# Patient Record
Sex: Female | Born: 1975 | Race: White | Hispanic: No | Marital: Single | Smoking: Never smoker
Health system: Southern US, Community
[De-identification: ages and names within clinical notes are randomized; demographics above are authoritative.]

## PROBLEM LIST (undated history)

## (undated) DIAGNOSIS — I10 Essential (primary) hypertension: Secondary | ICD-10-CM

## (undated) DIAGNOSIS — F419 Anxiety disorder, unspecified: Secondary | ICD-10-CM

## (undated) DIAGNOSIS — T7840XA Allergy, unspecified, initial encounter: Secondary | ICD-10-CM

## (undated) HISTORY — DX: Allergy, unspecified, initial encounter: T78.40XA

## (undated) HISTORY — DX: Essential (primary) hypertension: I10

## (undated) HISTORY — DX: Anxiety disorder, unspecified: F41.9

---

## 2001-08-14 ENCOUNTER — Emergency Department (HOSPITAL_COMMUNITY): Admission: EM | Admit: 2001-08-14 | Discharge: 2001-08-14 | Payer: Self-pay | Admitting: Emergency Medicine

## 2001-08-24 ENCOUNTER — Emergency Department (HOSPITAL_COMMUNITY): Admission: EM | Admit: 2001-08-24 | Discharge: 2001-08-24 | Payer: Self-pay | Admitting: Emergency Medicine

## 2004-02-07 ENCOUNTER — Encounter: Admission: RE | Admit: 2004-02-07 | Discharge: 2004-02-07 | Payer: Self-pay | Admitting: Obstetrics and Gynecology

## 2006-07-31 ENCOUNTER — Emergency Department (HOSPITAL_COMMUNITY): Admission: EM | Admit: 2006-07-31 | Discharge: 2006-07-31 | Payer: Self-pay | Admitting: Emergency Medicine

## 2013-01-07 ENCOUNTER — Other Ambulatory Visit: Payer: Self-pay | Admitting: Family Medicine

## 2013-01-07 ENCOUNTER — Other Ambulatory Visit (HOSPITAL_COMMUNITY)
Admission: RE | Admit: 2013-01-07 | Discharge: 2013-01-07 | Disposition: A | Discharge status: Home / Self Care | Payer: BC Managed Care – PPO | Source: Ambulatory Visit | Attending: Family Medicine | Admitting: Family Medicine

## 2013-01-07 DIAGNOSIS — Z0000 Encounter for general adult medical examination without abnormal findings: Secondary | ICD-10-CM | POA: Insufficient documentation

## 2016-07-23 ENCOUNTER — Ambulatory Visit (INDEPENDENT_AMBULATORY_CARE_PROVIDER_SITE_OTHER): Payer: BLUE CROSS/BLUE SHIELD | Admitting: Family Medicine

## 2016-07-23 ENCOUNTER — Encounter: Payer: Self-pay | Admitting: Family Medicine

## 2016-07-23 VITALS — BP 144/100 | HR 104 | Temp 99.3°F | Resp 18 | Ht 65.0 in | Wt 331.8 lb

## 2016-07-23 DIAGNOSIS — J301 Allergic rhinitis due to pollen: Secondary | ICD-10-CM | POA: Diagnosis not present

## 2016-07-23 DIAGNOSIS — R7303 Prediabetes: Secondary | ICD-10-CM

## 2016-07-23 DIAGNOSIS — I10 Essential (primary) hypertension: Secondary | ICD-10-CM | POA: Diagnosis not present

## 2016-07-23 MED ORDER — CETIRIZINE HCL 10 MG PO TABS: 10.0000 mg | ORAL_TABLET | Freq: Every day | ORAL | 11 refills | Status: DC

## 2016-07-23 MED ORDER — FLUTICASONE PROPIONATE 50 MCG/ACT NA SUSP: 2.0000 | Freq: Every day | NASAL | 6 refills | Status: DC

## 2016-07-23 MED ORDER — AMLODIPINE BESYLATE 5 MG PO TABS: 5.0000 mg | ORAL_TABLET | Freq: Every day | ORAL | 1 refills | Status: DC

## 2016-07-23 NOTE — Progress Notes (Signed)
Chief Complaint  Patient presents with  . Establish Care    HPI  Hypertension- deteriorated Pt reports that she is here for a six month check up for hypertension She reports that she wanted to establish care for her blood pressure She was started on blood pressure meds 3 years ago She states  BP Readings from Last 3 Encounters:  07/23/16 (!) 144/100  She is a nonsmoker She does not exercise  Morbid obesity She reports that she has tried walking in the past with exercise classes She reports that she has never done a nutritionist or weight watchers program This morning she had a sausage biscuit and hard boiled egg with orange juice. For lunch she had a hot pockets For dinner she plans to eat chicken and rice and broccoli Wt Readings from Last 3 Encounters:  07/23/16 (!) 331 lb 12.8 oz (150.5 kg)  Body mass index is 55.21 kg/m.  Allergic Rhinitis She reports sinus pressure and headache with stuffiness, cough and sneezing She states that she started having symptoms 3 days ago She denies fevers or chills In the mornings her mouth is dry and she is coughing a lot Her cough is typically a dry cough She has not tried anything for her symptoms    Past Medical History:  Diagnosis Date  . Allergy   . Anxiety   . Hypertension     Current Outpatient Prescriptions  Medication Sig Dispense Refill  . hydrochlorothiazide (HYDRODIURIL) 25 MG tablet Take 25 mg by mouth daily.    Marland Kitchen losartan (COZAAR) 100 MG tablet Take 100 mg by mouth daily.    Marland Kitchen PARoxetine (PAXIL) 20 MG tablet Take 20 mg by mouth daily.    Marland Kitchen amLODipine (NORVASC) 5 MG tablet Take 1 tablet (5 mg total) by mouth daily. 30 tablet 1  . cetirizine (ZYRTEC) 10 MG tablet Take 1 tablet (10 mg total) by mouth daily. 30 tablet 11  . fluticasone (FLONASE) 50 MCG/ACT nasal spray Place 2 sprays into both nostrils daily. 16 g 6   No current facility-administered medications for this visit.     Allergies:  Allergies  Allergen  Reactions  . Sulfa Antibiotics [place]    History reviewed. No pertinent surgical history.  Social History   Social History  . Marital status: Single    Spouse name: N/A  . Number of children: N/A  . Years of education: N/A   Social History Main Topics  . Smoking status: Never Smoker  . Smokeless tobacco: Never Used  . Alcohol use 3.0 oz/week    5 Glasses of wine per week     Comment: glasses a month  . Drug use: No  . Sexual activity: Not Asked   Other Topics Concern  . None   Social History Narrative  . None    ROS See hpi  Objective: Vitals:   07/23/16 1658  BP: (!) 144/100  Pulse: (!) 104  Resp: 18  Temp: 99.3 F (37.4 C)  TempSrc: Oral  SpO2: 96%  Weight: (!) 331 lb 12.8 oz (150.5 kg)  Height: 5\' 5"  (1.651 m)    Physical Exam  Constitutional: She is oriented to person, place, and time. She appears well-developed and well-nourished.  HENT:  Head: Normocephalic and atraumatic.  Right Ear: External ear normal.  Left Ear: External ear normal.  Mouth/Throat: Oropharynx is clear and moist.  Eyes: Conjunctivae and EOM are normal.  Cardiovascular: Normal rate, regular rhythm and normal heart sounds.   No murmur heard. Pulmonary/Chest:  Effort normal and breath sounds normal. No respiratory distress. She has no wheezes.  Musculoskeletal: Normal range of motion. She exhibits no edema.  Neurological: She is alert and oriented to person, place, and time. No cranial nerve deficit. Coordination normal.  Skin: Skin is warm. No erythema.    Assessment and Plan Novaly was seen today for establish care.  Diagnoses and all orders for this visit:  Essential hypertension- blood pressure uncontrolled Added amlodipine Discussed risks, benefits and side effects -     Basic metabolic panel  Morbid obesity ([place])- discussed strategies for weight loss Discussed nutrition and weight watchers type programs -     Hemoglobin A1c -     Ambulatory referral to diabetic  education  Prediabetes- advised pt to monitor her diet, calories and exercise Discussed that starting an ADA diet now can help slow progression Also discussed Metformin -     Hemoglobin A1c -     Ambulatory referral to diabetic education  Allergic Rhinitis Discussed adding flonase and zyrtec  Other orders -     amLODipine (NORVASC) 5 MG tablet; Take 1 tablet (5 mg total) by mouth daily. -     fluticasone (FLONASE) 50 MCG/ACT nasal spray; Place 2 sprays into both nostrils daily. -     cetirizine (ZYRTEC) 10 MG tablet; Take 1 tablet (10 mg total) by mouth daily.     [place]

## 2016-07-23 NOTE — Patient Instructions (Addendum)
IF you received an x-ray today, you will receive an invoice from [facility]. Please contact [facility] at [phone] with questions or concerns regarding your invoice.   IF you received labwork today, you will receive an invoice from [employer]. Please contact [facility] at [phone] with questions or concerns regarding your invoice.   Our billing staff will not be able to assist you with questions regarding bills from these companies.  You will be contacted with the lab results as soon as they are available. The fastest way to get your results is to activate your My Chart account. Instructions are located on the last page of this paperwork. If you have not heard from [place] regarding the results in 2 weeks, please contact this office.      Managing Your Hypertension Hypertension is commonly called high blood pressure. Blood pressure is a measurement of how strongly your blood is pressing against the walls of your arteries. Arteries are blood vessels that carry blood from your heart throughout your body. Blood pressure does not stay the same. It rises when you are active, excited, or nervous. It lowers when you are sleeping or relaxed. If the numbers that measure your blood pressure stay above normal most of the time, you are at risk for health problems. Hypertension is a long-term (chronic) condition in which blood pressure is elevated. This condition often has no signs or symptoms. The cause of the condition is usually not known. What are blood pressure readings? A blood pressure reading is recorded as two numbers, such as "120 over 80" (or 120/80). The first ("top") number is called the systolic pressure. It is a measure of the pressure in your arteries as the heart beats. The second ("bottom") number is called the diastolic pressure. It is a measure of the pressure in your arteries as the heart relaxes between beats. What does my blood  pressure reading mean? Blood pressure is classified into four stages. Based on your blood pressure reading, your health care provider may use the following stages to determine what type of treatment, if any, is needed. Systolic pressure and diastolic pressure are measured in a unit called mm Hg. Normal  Systolic pressure: below 123456.  Diastolic pressure: below 80. Prehypertension  Systolic pressure: 123456.  Diastolic pressure: [id number]. Hypertension stage 1  Systolic pressure: A999333.  Diastolic pressure: A999333. Hypertension stage 2  Systolic pressure: 0000000 or above.  Diastolic pressure: 123XX123 or above. What health risks are associated with hypertension? Managing your hypertension is an important responsibility. Uncontrolled hypertension can lead to:  A heart attack.  A stroke.  A weakened blood vessel (aneurysm).  Heart failure.  Kidney damage.  Eye damage.  Metabolic syndrome.  Memory and concentration problems. What changes can I make to manage my hypertension? Hypertension can be managed effectively by making lifestyle changes and possibly by taking medicines. Your health care provider will help you come up with a plan to bring your blood pressure within a normal range. Your plan should include the following: Monitoring  Monitor your blood pressure at home as told by your health care provider. Your personal target blood pressure may vary depending on your medical conditions, your age, and other factors.  Have your blood pressure rechecked as told by your health care provider. Lifestyle  Lose weight if necessary.  Get at least 30-45 minutes of aerobic exercise at least 4 times a week.  Do not use any products that contain nicotine or tobacco, such  as cigarettes and e-cigarettes. If you need help quitting, ask your health care provider.  Learn ways to reduce stress.  Control any chronic conditions, such as high cholesterol or diabetes. Eating and  drinking  Follow the DASH diet. This diet is high in fruits, vegetables, and whole grains. It is low in salt, red meat, and added sugars.  Keep your sodium intake below 2,300 mg per day.  Limit alcoholic beverages. Communication  Review all the medicines you take with your health care provider because there may be side effects or interactions.  Talk with your health care provider about your diet, exercise habits, and other lifestyle factors that may be contributing to hypertension.  See your health care provider regularly. Your health care provider can help you create and adjust your plan for managing hypertension. Will I need medicine to control my blood pressure? Your health care provider may prescribe medicine if lifestyle changes are not enough to get your blood pressure under control, and if one of the following is true:  You are 34-48 years of age, and your systolic blood pressure is 140 or higher.  You are 61 years of age or older, and your systolic blood pressure is 150 or higher.  Your diastolic blood pressure is 90 or higher.  You have diabetes, and your systolic blood pressure is over [id number] or your diastolic blood pressure is over 90.  You have kidney disease, and your blood pressure is above 140/90.  You have heart disease or a history of stroke, and your blood pressure is 140/90 or higher. Take medicines only as told by your health care provider. Follow the directions carefully. Blood pressure medicines must be taken as prescribed. The medicine does not work as well when you skip doses. Skipping doses also puts you at risk for problems. Contact a health care provider if:  You think you are having a reaction to medicines you have taken.  You have repeated (recurrent) headaches.  You feel dizzy.  You have swelling in your ankles.  You have trouble with your vision. Get help right away if:  You develop a severe headache or confusion.  You have unusual weakness or  numbness, or you feel faint.  You have severe pain in your chest or abdomen.  You vomit repeatedly.  You have trouble breathing. This information is not intended to replace advice given to you by your health care provider. Make sure you discuss any questions you have with your health care provider. Document Released: 04/21/2012 Document Revised: 04/01/2016 Document Reviewed: 10/26/2015 Elsevier Interactive Patient Education  2017 [employer].

## 2016-07-24 DIAGNOSIS — R7303 Prediabetes: Secondary | ICD-10-CM | POA: Insufficient documentation

## 2016-07-24 DIAGNOSIS — J301 Allergic rhinitis due to pollen: Secondary | ICD-10-CM | POA: Insufficient documentation

## 2016-07-24 DIAGNOSIS — I10 Essential (primary) hypertension: Secondary | ICD-10-CM | POA: Insufficient documentation

## 2016-07-24 LAB — HEMOGLOBIN A1C

## 2016-07-25 LAB — BASIC METABOLIC PANEL
BUN/Creatinine Ratio: 18 (ref 9–23)
BUN: 13 mg/dL (ref 6–20)
CO2: 27 mmol/L (ref 18–29)
CREATININE: 0.73 mg/dL (ref 0.57–1.00)
Calcium: 9.9 mg/dL (ref 8.7–10.2)
Chloride: 95 mmol/L — ABNORMAL LOW (ref 96–106)
GFR calc Af Amer: 121 mL/min/{1.73_m2} (ref >59)
GFR calc non Af Amer: 105 mL/min/{1.73_m2} (ref >59)
GLUCOSE: 124 mg/dL — AB (ref 65–99)
Potassium: 4.1 mmol/L (ref 3.5–5.2)
Sodium: 139 mmol/L (ref 134–144)

## 2016-09-03 ENCOUNTER — Ambulatory Visit (INDEPENDENT_AMBULATORY_CARE_PROVIDER_SITE_OTHER): Payer: BLUE CROSS/BLUE SHIELD | Admitting: Family Medicine

## 2016-09-03 ENCOUNTER — Encounter: Payer: Self-pay | Admitting: Family Medicine

## 2016-09-03 ENCOUNTER — Other Ambulatory Visit: Payer: Self-pay

## 2016-09-03 VITALS — BP 147/98 | HR 94 | Temp 99.0°F | Resp 18 | Ht 65.0 in | Wt 331.0 lb

## 2016-09-03 DIAGNOSIS — F411 Generalized anxiety disorder: Secondary | ICD-10-CM | POA: Diagnosis not present

## 2016-09-03 DIAGNOSIS — Z23 Encounter for immunization: Secondary | ICD-10-CM

## 2016-09-03 DIAGNOSIS — I10 Essential (primary) hypertension: Secondary | ICD-10-CM

## 2016-09-03 MED ORDER — AMLODIPINE BESYLATE 10 MG PO TABS: 10.0000 mg | ORAL_TABLET | Freq: Every day | ORAL | 3 refills | Status: DC

## 2016-09-03 MED ORDER — PAROXETINE HCL 20 MG PO TABS: 20.0000 mg | ORAL_TABLET | Freq: Every day | ORAL | 11 refills | Status: DC

## 2016-09-03 NOTE — Telephone Encounter (Signed)
Fax req [facility] [address]  Paroxetine Sent to [facility]

## 2016-09-03 NOTE — Patient Instructions (Addendum)
4-6 weeks follow up     IF you received an x-ray today, you will receive an invoice from [facility]. Please contact [facility] at [phone] with questions or concerns regarding your invoice.   IF you received labwork today, you will receive an invoice from [facility]. Please contact [facility] at [phone] with questions or concerns regarding your invoice.   Our billing staff will not be able to assist you with questions regarding bills from these companies.  You will be contacted with the lab results as soon as they are available. The fastest way to get your results is to activate your My Chart account. Instructions are located on the last page of this paperwork. If you have not heard from [place] regarding the results in 2 weeks, please contact this office.

## 2016-09-03 NOTE — Progress Notes (Signed)
Chief Complaint  Patient presents with  . Follow-up    BLOOD PRESSURE/PAXIL    HPI  Hypertension: Patient here for follow-up of elevated blood pressure. She is exercising and is adherent to low salt diet.  Blood pressure is well controlled at home. Cardiac symptoms none. Patient denies chest pain, chest pressure/discomfort, claudication, dyspnea and exertional chest pressure/discomfort.  Cardiovascular risk factors: hypertension and obesity (BMI >= 30 kg/m2). Use of agents associated with hypertension: none. History of target organ damage: none. BP Readings from Last 3 Encounters:  09/03/16 (!) 147/98  07/23/16 (!) 144/100   Obesity She states that she has days when she feels very hungry.  She tries not to over eat on those days but has difficulty. She is walking for exercise and tries to do 30 minutes. Wt Readings from Last 3 Encounters:  09/03/16 (!) 331 lb (150.1 kg)  07/23/16 (!) 331 lb 12.8 oz (150.5 kg)   Anxiety  She reports that she is doing well with Paxil No side effects She is walking for exercise. She denies panic attacks or depression.   Past Medical History:  Diagnosis Date  . Allergy   . Anxiety   . Hypertension     Current Outpatient Prescriptions  Medication Sig Dispense Refill  . amLODipine (NORVASC) 10 MG tablet Take 1 tablet (10 mg total) by mouth daily. 90 tablet 3  . cetirizine (ZYRTEC) 10 MG tablet Take 1 tablet (10 mg total) by mouth daily. 30 tablet 11  . fluticasone (FLONASE) 50 MCG/ACT nasal spray Place 2 sprays into both nostrils daily. 16 g 6  . hydrochlorothiazide (HYDRODIURIL) 25 MG tablet Take 25 mg by mouth daily.    Marland Kitchen losartan (COZAAR) 100 MG tablet Take 100 mg by mouth daily.    Marland Kitchen PARoxetine (PAXIL) 20 MG tablet Take 1 tablet (20 mg total) by mouth daily. 30 tablet 11   No current facility-administered medications for this visit.     Allergies:  Allergies  Allergen Reactions  . Sulfa Antibiotics [place]    History reviewed. No  pertinent surgical history.  Social History   Social History  . Marital status: Single    Spouse name: N/A  . Number of children: N/A  . Years of education: N/A   Social History Main Topics  . Smoking status: Never Smoker  . Smokeless tobacco: Never Used  . Alcohol use 3.0 oz/week    5 Glasses of wine per week     Comment: glasses a month  . Drug use: No  . Sexual activity: Not Asked   Other Topics Concern  . None   Social History Narrative  . None    ROS  Objective: Vitals:   09/03/16 1619 09/03/16 1712  BP: (!) 160/98 (!) 147/98  Pulse: 94   Resp: 18   Temp: 99 F (37.2 C)   TempSrc: Oral   SpO2: 96%   Weight: (!) 331 lb (150.1 kg)   Height: 5\' 5"  (1.651 m)     Physical Exam  Constitutional: She is oriented to person, place, and time. She appears well-developed and well-nourished.  HENT:  Head: Normocephalic and atraumatic.  Eyes: Conjunctivae and EOM are normal.  Cardiovascular: Normal rate, regular rhythm and normal heart sounds.   Pulmonary/Chest: Effort normal and breath sounds normal. No respiratory distress. She has no wheezes.  Musculoskeletal: Normal range of motion. She exhibits no edema.  Neurological: She is alert and oriented to person, place, and time.  Skin: Skin is warm. Capillary refill  takes less than 2 seconds. No erythema.  Psychiatric: She has a normal mood and affect. Her behavior is normal. Judgment and thought content normal.    Assessment and Plan [place] was seen today for follow-up.  Diagnoses and all orders for this visit:  Need for prophylactic vaccination and inoculation against influenza -     Flu Vaccine QUAD 36+ mos IM  Essential hypertension- bp improved on recheck, increased amlodipine dose  Morbid obesity ([place])- plateau noted, offered encouragement and discussed strategies  Anxiety state- continue Paxil and exercise  Other orders -     PARoxetine (PAXIL) 20 MG tablet; Take 1 tablet (20 mg total) by mouth  daily. -     amLODipine (NORVASC) 10 MG tablet; Take 1 tablet (10 mg total) by mouth daily.   Blood pressure check Rooming staff to notify me so that this can be an efficient visit  [place]

## 2016-10-08 ENCOUNTER — Ambulatory Visit (INDEPENDENT_AMBULATORY_CARE_PROVIDER_SITE_OTHER): Payer: BLUE CROSS/BLUE SHIELD | Admitting: Family Medicine

## 2016-10-08 ENCOUNTER — Encounter: Payer: Self-pay | Admitting: Family Medicine

## 2016-10-08 VITALS — BP 158/97 | HR 86 | Temp 99.0°F | Resp 18 | Ht 65.0 in | Wt 330.0 lb

## 2016-10-08 DIAGNOSIS — I10 Essential (primary) hypertension: Secondary | ICD-10-CM

## 2016-10-08 MED ORDER — CARVEDILOL 6.25 MG PO TABS: 6.2500 mg | ORAL_TABLET | Freq: Two times a day (BID) | ORAL | 3 refills | Status: DC

## 2016-10-08 NOTE — Progress Notes (Signed)
Chief Complaint  Patient presents with  . Follow-up    HTN    HPI   Hypertension: Patient here for follow-up of elevated blood pressure. She is exercising and is adherent to low salt diet.  Blood pressure is well controlled at home. Cardiac symptoms none. Patient denies chest pain, chest pressure/discomfort, claudication, dyspnea and exertional chest pressure/discomfort.  Cardiovascular risk factors: hypertension and obesity (BMI >= 30 kg/m2). Use of agents associated with hypertension: none. History of target organ damage: none.  She takes her blood pressure medications in the morning.  BP Readings from Last 3 Encounters:  10/08/16 (!) 158/97  09/03/16 (!) 147/98  07/23/16 (!) 144/100    Obesity She states that she has days when she feels very hungry.  She tries not to over eat on those days but has difficulty. She is walking for exercise and tries to do 30 minutes.  She denies chest pains with exercise Wt Readings from Last 3 Encounters:  10/08/16 (!) 330 lb (149.7 kg)  09/03/16 (!) 331 lb (150.1 kg)  07/23/16 (!) 331 lb 12.8 oz (150.5 kg)   Body mass index is 54.91 kg/m.    Past Medical History:  Diagnosis Date  . Allergy   . Anxiety   . Hypertension     Current Outpatient Prescriptions  Medication Sig Dispense Refill  . amLODipine (NORVASC) 10 MG tablet Take 1 tablet (10 mg total) by mouth daily. 90 tablet 3  . cetirizine (ZYRTEC) 10 MG tablet Take 1 tablet (10 mg total) by mouth daily. 30 tablet 11  . fluticasone (FLONASE) 50 MCG/ACT nasal spray Place 2 sprays into both nostrils daily. 16 g 6  . hydrochlorothiazide (HYDRODIURIL) 25 MG tablet Take 25 mg by mouth daily.    Marland Kitchen losartan (COZAAR) 100 MG tablet Take 100 mg by mouth daily.    Marland Kitchen PARoxetine (PAXIL) 20 MG tablet Take 1 tablet (20 mg total) by mouth daily. 30 tablet 11  . carvedilol (COREG) 6.25 MG tablet Take 1 tablet (6.25 mg total) by mouth 2 (two) times daily with a meal. 60 tablet 3   No current  facility-administered medications for this visit.     Allergies:  Allergies  Allergen Reactions  . Sulfa Antibiotics [place]    History reviewed. No pertinent surgical history.  Social History   Social History  . Marital status: Single    Spouse name: N/A  . Number of children: N/A  . Years of education: N/A   Social History Main Topics  . Smoking status: Never Smoker  . Smokeless tobacco: Never Used  . Alcohol use 3.0 oz/week    5 Glasses of wine per week     Comment: glasses a month  . Drug use: No  . Sexual activity: Not Asked   Other Topics Concern  . None   Social History Narrative  . None    Review of Systems  Eyes: Negative for blurred vision and double vision.  Respiratory: Negative for cough, shortness of breath and wheezing.   Cardiovascular: Negative for chest pain and leg swelling.  Gastrointestinal: Negative for abdominal pain, nausea and vomiting.  Neurological: Negative for dizziness, tingling, tremors and headaches.   See hpi  Objective: Vitals:   10/08/16 1639  BP: (!) 158/97  Pulse: 86  Resp: 18  Temp: 99 F (37.2 C)  TempSrc: Oral  SpO2: 93%  Weight: (!) 330 lb (149.7 kg)  Height: 5\' 5"  (1.651 m)    Physical Exam  Constitutional: She is oriented  to person, place, and time. She appears well-developed and well-nourished.  HENT:  Head: Normocephalic and atraumatic.  Eyes: Conjunctivae and EOM are normal.  Cardiovascular: Normal rate, regular rhythm and normal heart sounds.   No murmur heard. Pulmonary/Chest: Effort normal and breath sounds normal. No respiratory distress.  Neurological: She is oriented to person, place, and time.  Skin: Skin is warm. Capillary refill takes less than 2 seconds. No erythema.  Psychiatric: She has a normal mood and affect. Her behavior is normal. Judgment and thought content normal.    ECG shows nsr, no twi, no st segment elevation  Assessment and Plan [place] was seen today for  follow-up.  Diagnoses and all orders for this visit:  uncontrolled hypertension- ecg without ischemic changes Added coreg  Follow up in one month Reviewed signs of hypotension and bradycardia with pt -     EKG 12-Lead  Morbid obesity ([place])- advised pt to continue exercise plan  Will screen for diabetes at the next visit  Other orders -     carvedilol (COREG) 6.25 MG tablet; Take 1 tablet (6.25 mg total) by mouth 2 (two) times daily with a meal.     [place] Yang

## 2016-10-08 NOTE — Patient Instructions (Addendum)
   IF you received an x-ray today, you will receive an invoice from [facility]. Please contact [facility] at [phone] with questions or concerns regarding your invoice.   IF you received labwork today, you will receive an invoice from [facility]. Please contact [facility] at [phone] with questions or concerns regarding your invoice.   Our billing staff will not be able to assist you with questions regarding bills from these companies.  You will be contacted with the lab results as soon as they are available. The fastest way to get your results is to activate your My Chart account. Instructions are located on the last page of this paperwork. If you have not heard from us regarding the results in 2 weeks, please contact this office.         Managing Your Hypertension Hypertension is commonly called high blood pressure. This is when the force of your blood pressing against the walls of your arteries is too strong. Arteries are blood vessels that carry blood from your heart throughout your body. Hypertension forces the heart to work harder to pump blood, and may cause the arteries to become narrow or stiff. Having untreated or uncontrolled hypertension can cause heart attack, stroke, kidney disease, and other problems. What are blood pressure readings? A blood pressure reading consists of a higher number over a lower number. Ideally, your blood pressure should be below 120/80. The first ("top") number is called the systolic pressure. It is a measure of the pressure in your arteries as your heart beats. The second ("bottom") number is called the diastolic pressure. It is a measure of the pressure in your arteries as the heart relaxes. What does my blood pressure reading mean? Blood pressure is classified into four stages. Based on your blood pressure reading, your health care provider may use the following stages to determine what type of treatment you need, if any.  Systolic pressure and diastolic pressure are measured in a unit called mm Hg. Normal   Systolic pressure: below 120.  Diastolic pressure: below 80. Elevated   Systolic pressure: 120-129.  Diastolic pressure: below 80. Hypertension stage 1     Diastolic pressure: 80-89. Hypertension stage 2   Systolic pressure: 140 or above.  Diastolic pressure: 90 or above. What health risks are associated with hypertension? Managing your hypertension is an important responsibility. Uncontrolled hypertension can lead to:  A heart attack.  A stroke.  A weakened blood vessel (aneurysm).  Heart failure.  Kidney damage.  Eye damage.  Metabolic syndrome.  Memory and concentration problems. What changes can I make to manage my hypertension? Eating and drinking   Eat a diet that is high in fiber and potassium, and low in salt (sodium), added sugar, and fat. An example eating plan is called the DASH (Dietary Approaches to Stop Hypertension) diet. To eat this way:  Eat plenty of fresh fruits and vegetables. Try to fill half of your plate at each meal with fruits and vegetables.  Eat whole grains, such as whole wheat pasta, brown rice, or whole grain bread. Fill about one quarter of your plate with whole grains.  Eat low-fat diary products.  Avoid fatty cuts of meat, processed or cured meats, and poultry with skin. Fill about one quarter of your plate with lean proteins such as fish, chicken without skin, beans, eggs, and tofu.  Avoid premade and processed foods. These tend to be higher in sodium, added sugar, and fat.     Lifestyle   Work   with your health care provider to maintain a healthy body weight, or to lose weight. Ask what an ideal weight is for you.  Get at least 30 minutes of exercise that causes your heart to beat faster (aerobic exercise) most days of the week. Activities may include walking, swimming, or biking.       Monitoring   Monitor your blood pressure  at home as told by your health care provider. Your personal target blood pressure may vary depending on your medical conditions, your age, and other factors.  Have your blood pressure checked regularly, as often as told by your health care provider. Working with your health care provider   Review all the medicines you take with your health care provider because there may be side effects or interactions.  Talk with your health care provider about your diet, exercise habits, and other lifestyle factors that may be contributing to hypertension.  Visit your health care provider regularly. Your health care provider can help you create and adjust your plan for managing hypertension. Will I need medicine to control my blood pressure? Your health care provider may prescribe medicine if lifestyle changes are not enough to get your blood pressure under control, and if:  Your systolic blood pressure is 130 or higher.  Your diastolic blood pressure is 80 or higher. Take medicines only as told by your health care provider. Follow the directions carefully. Blood pressure medicines must be taken as prescribed. The medicine does not work as well when you skip doses. Skipping doses also puts you at risk for problems. Contact a health care provider if:  You think you are having a reaction to medicines you have taken.  You have repeated (recurrent) headaches.  You feel dizzy.  You have swelling in your ankles.  You have trouble with your vision. Get help right away if:  You develop a severe headache or confusion.  You have unusual weakness or numbness, or you feel faint.  You have severe pain in your chest or abdomen.  You vomit repeatedly.  You have trouble breathing. Summary  Hypertension is when the force of blood pumping through your arteries is too strong. If this condition is not controlled, it may put you at risk for serious complications.  Your personal target blood pressure may vary  depending on your medical conditions, your age, and other factors. For most people, a normal blood pressure is less than 120/80.  Hypertension is managed by lifestyle changes, medicines, or both. Lifestyle changes include weight loss, eating a healthy, low-sodium diet, exercising more, and limiting alcohol. This information is not intended to replace advice given to you by your health care provider. Make sure you discuss any questions you have with your health care provider. Document Released: 04/21/2012 Document Revised: 06/25/2016 Document Reviewed: 06/25/2016 Elsevier Interactive Patient Education  2017 Elsevier Inc.  

## 2016-11-05 ENCOUNTER — Telehealth: Payer: Self-pay | Admitting: Family Medicine

## 2016-11-05 ENCOUNTER — Ambulatory Visit (INDEPENDENT_AMBULATORY_CARE_PROVIDER_SITE_OTHER): Payer: BLUE CROSS/BLUE SHIELD | Admitting: Family Medicine

## 2016-11-05 ENCOUNTER — Encounter: Payer: Self-pay | Admitting: Family Medicine

## 2016-11-05 VITALS — BP 150/91 | HR 93 | Temp 98.6°F | Resp 16 | Ht 65.0 in | Wt 306.0 lb

## 2016-11-05 DIAGNOSIS — Z23 Encounter for immunization: Secondary | ICD-10-CM | POA: Diagnosis not present

## 2016-11-05 DIAGNOSIS — E119 Type 2 diabetes mellitus without complications: Secondary | ICD-10-CM

## 2016-11-05 DIAGNOSIS — I10 Essential (primary) hypertension: Secondary | ICD-10-CM | POA: Diagnosis not present

## 2016-11-05 DIAGNOSIS — R7303 Prediabetes: Secondary | ICD-10-CM | POA: Diagnosis not present

## 2016-11-05 LAB — POCT GLYCOSYLATED HEMOGLOBIN (HGB A1C): HEMOGLOBIN A1C: 7.2

## 2016-11-05 MED ORDER — CARVEDILOL 12.5 MG PO TABS: 12.5000 mg | ORAL_TABLET | Freq: Two times a day (BID) | ORAL | 3 refills | Status: DC

## 2016-11-05 MED ORDER — METFORMIN HCL 500 MG PO TABS: 500.0000 mg | ORAL_TABLET | Freq: Every day | ORAL | 1 refills | Status: DC

## 2016-11-05 MED ORDER — CARVEDILOL 12.5 MG PO TABS: 25.0000 mg | ORAL_TABLET | Freq: Two times a day (BID) | ORAL | 1 refills | Status: DC

## 2016-11-05 MED ORDER — CARVEDILOL 12.5 MG PO TABS: 12.5000 mg | ORAL_TABLET | Freq: Two times a day (BID) | ORAL | 1 refills | Status: DC

## 2016-11-05 NOTE — Progress Notes (Signed)
Chief Complaint  Patient presents with  . Follow-up    BP     HPI   Hypertension: Patient here for follow-up of elevated blood pressure. Sheisexercising and isadherent to low salt diet. Blood pressure iswell controlled at home. Cardiac symptoms none. Patient denies chest pain, chest pressure/discomfort, claudication, dyspnea and exertional chest pressure/discomfort. Cardiovascular risk factors: hypertension and obesity (BMI >= 30 kg/m2). Use of agents associated with hypertension: none. History of target organ damage: none.  She takes her blood pressure medications in the morning.  Last visit she had carvedilol added  BP Readings from Last 3 Encounters:  11/05/16 (!) 150/91  10/08/16 (!) 158/97  09/03/16 (!) 147/98    Obesity She states that she has days when she feels very hungry. She tries not to over eat on those days but has difficulty. She is walking for exercise and tries to do 30 minutes.  She denies chest pains with exercise Wt Readings from Last 3 Encounters:  11/05/16 (!) 306 lb (138.8 kg)  10/08/16 (!) 330 lb (149.7 kg)  09/03/16 (!) 331 lb (150.1 kg)    Newly diagnosed diabetes Pt reports that in the past she was diagnosed with prediabetes a few years ago and this has not been followed up until now Today to follow up on her impaired fasting glucose her a1c was 7.2% She reports having an eye exam with Optometry 07/2016 and there was no retinopathy She reports that her father's side of the family has diabetes but none of her first degree relatives She recently lost 24 pounds with diet and exercise She denies dry mouth, polyphagia, polyuria, polydipsia She denies blurry vision, numbness and tingling Lab Results  Component Value Date   HGBA1C 7.2 11/05/2016    Past Medical History:  Diagnosis Date  . Allergy   . Anxiety   . Hypertension     Current Outpatient Prescriptions  Medication Sig Dispense Refill  . amLODipine (NORVASC) 10 MG tablet Take 1 tablet  (10 mg total) by mouth daily. 90 tablet 3  . cetirizine (ZYRTEC) 10 MG tablet Take 1 tablet (10 mg total) by mouth daily. 30 tablet 11  . fluticasone (FLONASE) 50 MCG/ACT nasal spray Place 2 sprays into both nostrils daily. 16 g 6  . hydrochlorothiazide (HYDRODIURIL) 25 MG tablet Take 25 mg by mouth daily.    Marland Kitchen losartan (COZAAR) 100 MG tablet Take 100 mg by mouth daily.    Marland Kitchen PARoxetine (PAXIL) 20 MG tablet Take 1 tablet (20 mg total) by mouth daily. 30 tablet 11  . carvedilol (COREG) 12.5 MG tablet Take 1 tablet (12.5 mg total) by mouth 2 (two) times daily with a meal. 60 tablet 3  . metFORMIN (GLUCOPHAGE) 500 MG tablet Take 1 tablet (500 mg total) by mouth daily with breakfast. 180 tablet 1   No current facility-administered medications for this visit.     Allergies:  Allergies  Allergen Reactions  . Sulfa Antibiotics Propes    No past surgical history on file.  Social History   Social History  . Marital status: Single    Spouse name: N/A  . Number of children: N/A  . Years of education: N/A   Social History Main Topics  . Smoking status: Never Smoker  . Smokeless tobacco: Never Used  . Alcohol use 3.0 oz/week    5 Glasses of wine per week     Comment: glasses a month  . Drug use: No  . Sexual activity: Not Asked   Other Topics  Concern  . None   Social History Narrative  . None    ROS See hpi  Objective: Vitals:   11/05/16 1346  BP: (!) 150/91  Pulse: 93  Resp: 16  Temp: 98.6 F (37 C)  TempSrc: Oral  Weight: (!) 306 lb (138.8 kg)  Height: 5\' 5"  (1.651 m)   Diabetic Foot Exam - Simple   Simple Foot Form Diabetic Foot exam was performed with the following findings:  Yes 11/05/2016  2:20 PM  Visual Inspection No deformities, no ulcerations, no other skin breakdown bilaterally:  Yes Sensation Testing Intact to touch and monofilament testing bilaterally:  Yes Pulse Check Posterior Tibialis and Dorsalis pulse intact bilaterally:  Yes Comments      Physical Exam  Constitutional: She is oriented to person, place, and time. She appears well-developed and well-nourished.  HENT:  Head: Normocephalic and atraumatic.  Eyes: Conjunctivae and EOM are normal.  Cardiovascular: Normal rate, regular rhythm and normal heart sounds.   No murmur heard. Pulmonary/Chest: Effort normal and breath sounds normal. No respiratory distress. She has no wheezes.  Neurological: She is alert and oriented to person, place, and time.  Skin: Skin is warm. Capillary refill takes less than 2 seconds.  Psychiatric: She has a normal mood and affect. Her behavior is normal. Judgment and thought content normal.    Assessment and Plan Ilean was seen today for follow-up.  Diagnoses and all orders for this visit:  uncontrolled hypertension- increased coreg Continue diet and exercise  Morbid obesity ([place])- improving with diet and exercise  Prediabetes- routine check and pt now diabetic -     POCT glycosylated hemoglobin (Hb A1C)  Newly diagnosed diabetes ([place])-  Routine office visit turned into new diagnosis with counseling for diabetes Will start with Metformin Refer to Diabetic Education -     Ambulatory referral to diabetic education -     Pneumococcal polysaccharide vaccine 23-valent greater than or equal to 2yo subcutaneous/IM -     HM Diabetes Foot Exam -     metFORMIN (GLUCOPHAGE) 500 MG tablet; Take 1 tablet (500 mg total) by mouth daily with breakfast.  Other orders -     carvedilol (COREG) 12.5 MG tablet; Take 1 tablet (12.5 mg total) by mouth 2 (two) times daily with a meal.  A total of 40 minutes were spent face-to-face with the patient during this encounter and over half of that time was spent on counseling and coordination of care.     [place]

## 2016-11-05 NOTE — Patient Instructions (Addendum)
[email or web address]     IF you received an x-ray today, you will receive an invoice from [facility]. Please contact [facility] at [phone] with questions or concerns regarding your invoice.   IF you received labwork today, you will receive an invoice from [facility]. Please contact [facility] at [phone] with questions or concerns regarding your invoice.   Our billing staff will not be able to assist you with questions regarding bills from these companies.  You will be contacted with the lab results as soon as they are available. The fastest way to get your results is to activate your My Chart account. Instructions are located on the last page of this paperwork. If you have not heard from [place] regarding the results in 2 weeks, please contact this office.      Diabetes Mellitus and Standards of Medical Care Managing diabetes (diabetes mellitus) can be complicated. Your diabetes treatment may be managed by a team of health care providers, including:  A diet and nutrition specialist (registered dietitian).  A nurse.  A certified diabetes educator (CDE).  A diabetes specialist (endocrinologist).  An eye doctor.  A primary care provider.  A dentist. Your health care providers follow a schedule in order to help you get the best quality of care. The following schedule is a general guideline for your diabetes management plan. Your health care providers may also give you more specific instructions. HbA1c ( hemoglobin A1c) test This test provides information about blood sugar (glucose) control over the previous 2-3 months. It is used to check whether your diabetes management plan needs to be adjusted.  If you are meeting your treatment goals, this test is done at least 2 times a year.  If you are not meeting treatment goals or if your treatment goals have changed, this test is done 4 times a year. Blood pressure test  This test is done at every routine medical visit.  For most people, the goal is less than 130/80. Ask your health care provider what your goal blood pressure should be. Dental and eye exams  Visit your dentist two times a year.  If you have type 1 diabetes, get an eye exam 3-5 years after you are diagnosed, and then once a year after your first exam.  If you were diagnosed with type 1 diabetes as a child, get an eye exam when you are age 67 or older and have had diabetes for 3-5 years. After the first exam, you should get an eye exam once a year.  If you have type 2 diabetes, have an eye exam as soon as you are diagnosed, and then once a year after your first exam. Foot care exam  Visual foot exams are done at every routine medical visit. The exams check for cuts, bruises, redness, blisters, sores, or other problems with the feet.  A complete foot exam is done by your health care provider once a year. This exam includes an inspection of the structure and skin of your feet, and a check of the pulses and sensation in your feet.  Type 1 diabetes: Get your first exam 3-5 years after diagnosis.  Type 2 diabetes: Get your first exam as soon as you are diagnosed.  Check your feet every day for cuts, bruises, redness, blisters, or sores. If you have any of these or other problems that are not healing, contact your health care provider. Kidney function test ( urine microalbumin)  This test is done once a year.  Type 1 diabetes:  Get your first test 5 years after diagnosis.  Type 2 diabetes: Get your first test as soon as you are diagnosed.  If you have chronic kidney disease (CKD), get a serum creatinine and estimated glomerular filtration rate (eGFR) test once a year. Lipid profile (cholesterol, HDL, LDL, triglycerides)  This test should be done when you are diagnosed with diabetes, and every 5 years after the first test. If you are on medicines to lower your cholesterol, you may need to get this test done every year.  The goal for LDL is  less than 100 mg/dL (5.5 mmol/L). If you are at high risk, the goal is less than 70 mg/dL (3.9 mmol/L).    The goal for triglycerides is less than 150 mg/dL (8.3 mmol/L). Immunizations  The yearly flu (influenza) vaccine is recommended for everyone 6 months or older who has diabetes.  The pneumonia (pneumococcal) vaccine is recommended for everyone 2 years or older who has diabetes. If you are 46 or older, you may get the pneumonia vaccine as a series of two separate shots.  The hepatitis B vaccine is recommended for adults shortly after they have been diagnosed with diabetes.     Mental and emotional health  Screening for symptoms of eating disorders, anxiety, and depression is recommended at the time of diagnosis and afterward as needed. If your screening shows that you have symptoms (you have a positive screening result), you may need further evaluation and be referred to a mental health care provider. Diabetes self-management education  Education about how to manage your diabetes is recommended at diagnosis and ongoing as needed. Treatment plan  Your treatment plan will be reviewed at every medical visit. Summary  Managing diabetes (diabetes mellitus) can be complicated. Your diabetes treatment may be managed by a team of health care providers.  Your health care providers follow a schedule in order to help you get the best quality of care.  Standards of care including having regular physical exams, blood tests, blood pressure monitoring, immunizations, screening tests, and education about how to manage your diabetes.  Your health care providers may also give you more specific instructions based on your individual health. This information is not intended to replace advice given to you by your health care provider. Make sure you discuss any questions you have with your health care provider. Document Released: 05/25/2009 Document Revised: 04/25/2016 Document Reviewed:  04/25/2016 Elsevier Interactive Patient Education  2017 [employer].

## 2016-11-05 NOTE — Telephone Encounter (Signed)
Pt was prescribed 2 different prescriptions for carvedilol. Pharmacist says there are different directions on each bottle. Pharmacy would like to know which to fill if either one. Pharmacy callback number is [phone].

## 2016-11-06 NOTE — Telephone Encounter (Signed)
Carvedilol 12.5mg  bid is most current per ov note, correct?

## 2016-11-07 NOTE — Telephone Encounter (Signed)
Yes. It was for carvedilol 12.5mg  bid

## 2016-11-07 NOTE — Telephone Encounter (Signed)
l/m to confirm with pharmacy

## 2016-12-09 ENCOUNTER — Encounter: Payer: BLUE CROSS/BLUE SHIELD | Attending: Family Medicine | Admitting: *Deleted

## 2016-12-09 DIAGNOSIS — Z713 Dietary counseling and surveillance: Secondary | ICD-10-CM | POA: Insufficient documentation

## 2016-12-09 DIAGNOSIS — E119 Type 2 diabetes mellitus without complications: Secondary | ICD-10-CM | POA: Insufficient documentation

## 2016-12-09 NOTE — Progress Notes (Signed)

## 2016-12-16 ENCOUNTER — Encounter: Payer: BLUE CROSS/BLUE SHIELD | Admitting: *Deleted

## 2016-12-16 DIAGNOSIS — E119 Type 2 diabetes mellitus without complications: Secondary | ICD-10-CM

## 2016-12-16 DIAGNOSIS — Z713 Dietary counseling and surveillance: Secondary | ICD-10-CM | POA: Diagnosis not present

## 2016-12-16 NOTE — Progress Notes (Signed)

## 2016-12-17 ENCOUNTER — Encounter: Payer: Self-pay | Admitting: Family Medicine

## 2016-12-17 ENCOUNTER — Ambulatory Visit (INDEPENDENT_AMBULATORY_CARE_PROVIDER_SITE_OTHER): Payer: BLUE CROSS/BLUE SHIELD | Admitting: Family Medicine

## 2016-12-17 VITALS — BP 145/82 | HR 86 | Temp 97.9°F | Resp 18 | Ht 67.0 in | Wt 323.8 lb

## 2016-12-17 DIAGNOSIS — Z5181 Encounter for therapeutic drug level monitoring: Secondary | ICD-10-CM | POA: Diagnosis not present

## 2016-12-17 DIAGNOSIS — E119 Type 2 diabetes mellitus without complications: Secondary | ICD-10-CM

## 2016-12-17 DIAGNOSIS — I10 Essential (primary) hypertension: Secondary | ICD-10-CM

## 2016-12-17 MED ORDER — HYDROCHLOROTHIAZIDE 25 MG PO TABS: 25.0000 mg | ORAL_TABLET | Freq: Every day | ORAL | 1 refills | Status: DC

## 2016-12-17 MED ORDER — LOSARTAN POTASSIUM 100 MG PO TABS: 100.0000 mg | ORAL_TABLET | Freq: Every day | ORAL | 1 refills | Status: DC

## 2016-12-17 NOTE — Progress Notes (Signed)
Chief Complaint  Patient presents with  . Follow-up  . Hypertension    HPI   Hypertension: Patient here for follow-up of elevated blood pressure. She is exercising and is adherent to low salt diet.  Blood pressure is well controlled at home. Cardiac symptoms none. Patient denies chest pain, dyspnea, exertional chest pressure/discomfort, irregular heart beat, lower extremity edema and palpitations.  Cardiovascular risk factors: diabetes mellitus, hypertension and obesity (BMI >= 30 kg/m2). Use of agents associated with hypertension: none. History of target organ damage: none.  She reports that she needs refills of her hctz and losartan. BP Readings from Last 3 Encounters:  12/17/16 (!) 145/82  11/05/16 (!) 150/91  10/08/16 (!) 158/97   Diabetes She reports that she is taking her metformin at dinner time instead of breakfast She takes 1/2 tablet before her meal and the second half half way through She reports that metformin has been having less side effects with taking it at dinner She has been doing to diabetic education and is learning about food and carb choices Lab Results  Component Value Date   HGBA1C 7.2 11/05/2016    Morbid Obesity Body mass index is 50.71 kg/m.  Pt is currently working with Nutrition focusing on carbohydrates and portion control She is drinking more water She is taking metformin  She is walking more at work [employer] Readings from Last 3 Encounters:  12/17/16 (!) 323 lb 12.8 oz (146.9 kg)  12/09/16 (!) 323 lb 14.4 oz (146.9 kg)  11/05/16 (!) 306 lb (138.8 kg)     Past Medical History:  Diagnosis Date  . Allergy   . Anxiety   . Hypertension     Current Outpatient Prescriptions  Medication Sig Dispense Refill  . amLODipine (NORVASC) 10 MG tablet Take 1 tablet (10 mg total) by mouth daily. 90 tablet 3  . carvedilol (COREG) 12.5 MG tablet Take 1 tablet (12.5 mg total) by mouth 2 (two) times daily with a meal. 60 tablet 3  . cetirizine (ZYRTEC) 10 MG  tablet Take 1 tablet (10 mg total) by mouth daily. 30 tablet 11  . fluticasone (FLONASE) 50 MCG/ACT nasal spray Place 2 sprays into both nostrils daily. 16 g 6  . hydrochlorothiazide (HYDRODIURIL) 25 MG tablet Take 1 tablet (25 mg total) by mouth daily. 90 tablet 1  . losartan (COZAAR) 100 MG tablet Take 1 tablet (100 mg total) by mouth daily. 90 tablet 1  . metFORMIN (GLUCOPHAGE) 500 MG tablet Take 1 tablet (500 mg total) by mouth daily with breakfast. 180 tablet 1  . PARoxetine (PAXIL) 20 MG tablet Take 1 tablet (20 mg total) by mouth daily. 30 tablet 11   No current facility-administered medications for this visit.     Allergies:  Allergies  Allergen Reactions  . Sulfa Antibiotics [place]    History reviewed. No pertinent surgical history.  Social History   Social History  . Marital status: Single    Spouse name: N/A  . Number of children: N/A  . Years of education: N/A   Social History Main Topics  . Smoking status: Never Smoker  . Smokeless tobacco: Never Used  . Alcohol use 3.0 oz/week    5 Glasses of wine per week     Comment: glasses a month  . Drug use: No  . Sexual activity: Not Asked   Other Topics Concern  . None   Social History Narrative  . None    Review of Systems  Constitutional: Negative for chills and fever.  Respiratory: Negative for cough, shortness of breath and wheezing.   Cardiovascular: Negative for chest pain and palpitations.  Gastrointestinal: Negative for abdominal pain, nausea and vomiting.  Skin: Negative for itching and [place].  Neurological: Negative for dizziness and headaches.  Psychiatric/Behavioral: Negative for depression. The patient is not nervous/anxious.    See hpi  Objective: Vitals:   12/17/16 1620  BP: (!) 145/82  Pulse: 86  Resp: 18  Temp: 97.9 F (36.6 C)  TempSrc: Oral  SpO2: 93%  Weight: (!) 323 lb 12.8 oz (146.9 kg)  Height: 5\' 7"  (1.702 m)    Physical Exam  Constitutional: She is oriented to person,  place, and time. She appears well-developed and well-nourished.  HENT:  Head: Normocephalic and atraumatic.  Eyes: Conjunctivae and EOM are normal.  Cardiovascular: Normal rate, regular rhythm and normal heart sounds.   Pulmonary/Chest: Effort normal and breath sounds normal. No respiratory distress. She has no wheezes.  Musculoskeletal: Normal range of motion. She exhibits no edema.  Neurological: She is alert and oriented to person, place, and time.  Psychiatric: She has a normal mood and affect. Her behavior is normal. Judgment and thought content normal.    Assessment and Plan Taylor Yang was seen today for follow-up and hypertension.  Diagnoses and all orders for this visit:  Encounter for medication monitoring -     Comprehensive metabolic panel  uncontrolled hypertension- improving, continue current doses, DASH diet, meds refilled today  Morbid obesity ([place])- very positive outlook, pt learning from Nutrition Offered supports and provided some strategies  Type 2 diabetes mellitus without complication, without long-term current use of insulin ([place])-  Stable on metformin   Other orders -     losartan (COZAAR) 100 MG tablet; Take 1 tablet (100 mg total) by mouth daily. -     hydrochlorothiazide (HYDRODIURIL) 25 MG tablet; Take 1 tablet (25 mg total) by mouth daily.  A total of 25 minutes were spent face-to-face with the patient during this encounter and over half of that time was spent on counseling and coordination of care.    [place]

## 2016-12-17 NOTE — Patient Instructions (Addendum)
IF you received an x-ray today, you will receive an invoice from [facility]. Please contact [facility] at [phone] with questions or concerns regarding your invoice.   IF you received labwork today, you will receive an invoice from [place]. Please contact [facility] at [phone] with questions or concerns regarding your invoice.   Our billing staff will not be able to assist you with questions regarding bills from these companies.  You will be contacted with the lab results as soon as they are available. The fastest way to get your results is to activate your My Chart account. Instructions are located on the last page of this paperwork. If you have not heard from [place] regarding the results in 2 weeks, please contact this office.      How to Avoid Diabetes Mellitus Problems You can take action to prevent or slow down problems that are caused by diabetes (diabetes mellitus). Following your diabetes plan and taking care of yourself can reduce your risk of serious or life-threatening complications. Manage your diabetes  Follow instructions from your health care providers about managing your diabetes. Your diabetes may be managed by a team of health care providers who can teach you how to care for yourself and can answer questions that you have.  Educate yourself about your condition so you can make healthy choices about eating and physical activity.  Check your blood sugar (glucose) levels as often as directed. Your health care provider will help you decide how often to check your blood glucose level depending on your treatment goals and how well you are meeting them.  Ask your health care provider if you should take low-dose aspirin daily and what dose is recommended for you. Taking low-dose aspirin daily is recommended to help prevent cardiovascular disease. Do not use nicotine or tobacco Do not use any products that contain nicotine or tobacco, such as cigarettes  and e-cigarettes. If you need help quitting, ask your health care provider. Nicotine raises your risk for diabetes problems. If you quit using nicotine:  You will lower your risk for heart attack, stroke, nerve disease, and kidney disease.  Your cholesterol and blood pressure may improve.  Your blood circulation will improve. Keep your blood pressure under control To control your blood pressure:  Follow instructions from your health care provider about meal planning, exercise, and medicines.  Make sure your health care provider checks your blood pressure at every medical visit. A blood pressure reading consists of two numbers. Generally, the goal is to keep your top number (systolic pressure) at or below 130, and your bottom number (diastolic pressure) at or below 80. Your health care provider may recommend a lower target blood pressure. Your individualized target blood pressure is determined based on:  Your age.  Your medicines.  How long you have had diabetes.  Any other medical conditions you have. Keep your cholesterol under control To control your cholesterol:  Follow instructions from your health care provider about meal planning, exercise, and medicines.  Have your cholesterol checked at least once a year.  You may be prescribed medicine to lower cholesterol (statin). If you are not taking a statin, ask your health care provider if you should be. Controlling your cholesterol may:  Help prevent heart disease and stroke. These are the most common health problems for people with diabetes.  Improve your blood flow. Schedule and keep yearly physical exams and eye exams Your health care provider will tell you how often you need medical  visits depending on your diabetes management plan. Keep all follow-up visits as directed. This is important so possible problems can be identified early and complications can be avoided or treated.  Every visit with your health care provider  should include measuring your:  Weight.  Blood pressure.  Blood glucose control.  Your A1c (hemoglobin A1c) level should be checked:  At least 2 times a year, if you are meeting your treatment goals.  4 times a year, if you are not meeting treatment goals or if your treatment goals have changed.  Your blood lipids (lipid profile) should be checked yearly. You should also be checked yearly for protein in your urine (urine microalbumin).  If you have type 1 diabetes, get an eye exam 3-5 years after you are diagnosed, and then once a year after your first exam.  If you have type 2 diabetes, get an eye exam as soon as you are diagnosed, and then once a year after your first exam. Keep your vaccines current It is recommended that you receive:  A flu (influenza) vaccine every year.  A pneumonia (pneumococcal) vaccine and a hepatitis B vaccine. If you are age 38 or older, you may get the pneumonia vaccine as a series of two separate shots. Ask your health care provider which other vaccines may be recommended. Take care of your feet Diabetes may cause you to have poor blood circulation to your legs and feet. Because of this, taking care of your feet is very important. Diabetes can cause:  The skin on the feet to get thinner, break more easily, and heal more slowly.  Nerve damage in your legs and feet, which results in decreased feeling. You may not notice minor injuries that could lead to serious problems. To avoid foot problems:  Check your skin and feet every day for cuts, bruises, redness, blisters, or sores.  Schedule a foot exam with your health care provider once every year. This exam includes:  Inspecting of the structure and skin of your feet.  Checking the pulses and sensation in your feet.  Make sure that your health care provider performs a visual foot exam at every medical visit. Take care of your teeth People with poorly controlled diabetes are more likely to have gum  (periodontal) disease. Diabetes can make periodontal diseases harder to control. If not treated, periodontal diseases can lead to tooth loss. To prevent this:  Brush your teeth twice a day.  Floss at least once a day.  Visit your dentist 2 times a year. Drink responsibly Limit alcohol intake to no more than 1 drink a day for nonpregnant women and 2 drinks a day for men. One drink equals 12 oz of beer, 5 oz of wine, or 1 oz of hard liquor. It is important to eat food when you drink alcohol to avoid low blood glucose (hypoglycemia). Avoid alcohol if you:  Have a history of alcohol abuse or dependence.  Are pregnant.  Have liver disease, pancreatitis, advanced neuropathy, or severe hypertriglyceridemia. Lessen stress Living with diabetes can be stressful. When you are experiencing stress, your blood glucose may be affected in two ways:  Stress hormones may cause your blood glucose to rise.  You may be distracted from taking good care of yourself. Be aware of your stress level and make changes to help you manage challenging situations. To lower your stress levels:  Consider joining a support group.  Do planned relaxation or meditation.  Do a hobby that you enjoy.  Maintain  healthy relationships.  Exercise regularly.  Work with your health care provider or a mental health professional. Summary  You can take action to prevent or slow down problems that are caused by diabetes (diabetes mellitus). Following your diabetes plan and taking care of yourself can reduce your risk of serious or life-threatening complications.  Follow instructions from your health care providers about managing your diabetes. Your diabetes may be managed by a team of health care providers who can teach you how to care for yourself and can answer questions that you have.  Your health care provider will tell you how often you need medical visits depending on your diabetes management plan. Keep all follow-up  visits as directed. This is important so possible problems can be identified early and complications can be avoided or treated. This information is not intended to replace advice given to you by your health care provider. Make sure you discuss any questions you have with your health care provider. Document Released: 04/15/2011 Document Revised: 04/26/2016 Document Reviewed: 04/26/2016 Elsevier Interactive Patient Education  2017 [employer].

## 2016-12-18 LAB — COMPREHENSIVE METABOLIC PANEL
A/G RATIO: 1.6 (ref 1.2–2.2)
ALBUMIN: 4.2 g/dL (ref 3.5–5.5)
ALT: 43 IU/L — ABNORMAL HIGH (ref 0–32)
AST: 27 IU/L (ref 0–40)
Alkaline Phosphatase: 102 IU/L (ref 39–117)
BUN / CREAT RATIO: 20 (ref 9–23)
BUN: 15 mg/dL (ref 6–20)
Bilirubin Total: 0.8 mg/dL (ref 0.0–1.2)
CALCIUM: 9.3 mg/dL (ref 8.7–10.2)
CHLORIDE: 101 mmol/L (ref 96–106)
CO2: 25 mmol/L (ref 18–29)
Creatinine, Ser: 0.76 mg/dL (ref 0.57–1.00)
GFR, EST AFRICAN AMERICAN: 114 mL/min/{1.73_m2} (ref >59)
GFR, EST NON AFRICAN AMERICAN: 99 mL/min/{1.73_m2} (ref >59)
GLOBULIN, TOTAL: 2.7 g/dL (ref 1.5–4.5)
Glucose: 191 mg/dL — ABNORMAL HIGH (ref 65–99)
POTASSIUM: 3.9 mmol/L (ref 3.5–5.2)
Sodium: 140 mmol/L (ref 134–144)
TOTAL PROTEIN: 6.9 g/dL (ref 6.0–8.5)

## 2016-12-23 ENCOUNTER — Encounter: Payer: BLUE CROSS/BLUE SHIELD | Admitting: *Deleted

## 2016-12-23 ENCOUNTER — Ambulatory Visit: Payer: Self-pay

## 2016-12-23 DIAGNOSIS — E119 Type 2 diabetes mellitus without complications: Secondary | ICD-10-CM

## 2016-12-23 DIAGNOSIS — Z713 Dietary counseling and surveillance: Secondary | ICD-10-CM | POA: Diagnosis not present

## 2016-12-23 NOTE — Progress Notes (Signed)
Patient was seen on 12/23/2016 for the third of a series of three diabetes self-management courses at [facility].   [place] the amount of activity recommended for healthy living . Describe activities suitable for individual needs . Identify ways to regularly incorporate activity into daily life . Identify barriers to activity and ways to over come these barriers  Identify diabetes medications being personally used and their primary action for lowering glucose and possible side effects . Describe role of stress on blood glucose and develop strategies to address psychosocial issues . Identify diabetes complications and ways to prevent them  Explain how to manage diabetes during illness . Evaluate success in meeting personal goal . Establish 2-3 goals that they will plan to diligently work on until they return for the  88-month follow-up visit  Goals:   I will count my carb choices at most meals and snacks  I will be active 30 minutes or more 5 times a week  Your patient has identified these potential barriers to change:  Motivation  Your patient has identified their diabetes self-care support plan as  [facility] Support Group  Plan:  Attend Support Group as desired

## 2016-12-30 ENCOUNTER — Ambulatory Visit: Payer: Self-pay

## 2017-01-06 ENCOUNTER — Ambulatory Visit: Payer: Self-pay

## 2017-01-21 ENCOUNTER — Encounter: Payer: Self-pay | Admitting: Family Medicine

## 2017-01-21 ENCOUNTER — Ambulatory Visit (INDEPENDENT_AMBULATORY_CARE_PROVIDER_SITE_OTHER): Payer: BLUE CROSS/BLUE SHIELD | Admitting: Family Medicine

## 2017-01-21 DIAGNOSIS — E119 Type 2 diabetes mellitus without complications: Secondary | ICD-10-CM

## 2017-01-21 DIAGNOSIS — I10 Essential (primary) hypertension: Secondary | ICD-10-CM

## 2017-01-21 LAB — POCT GLYCOSYLATED HEMOGLOBIN (HGB A1C): Hemoglobin A1C: 6.4

## 2017-01-21 MED ORDER — CARVEDILOL 12.5 MG PO TABS: 12.5000 mg | ORAL_TABLET | Freq: Two times a day (BID) | ORAL | 1 refills | Status: DC

## 2017-01-21 NOTE — Patient Instructions (Addendum)
Component            Latest Ref Rng & Units 11/05/2016 01/21/2017  Hemoglobin A1C      7.2 6.4     IF you received an x-ray today, you will receive an invoice from [facility]. Please contact [facility] at [phone] with questions or concerns regarding your invoice.   IF you received labwork today, you will receive an invoice from [place]. Please contact [facility] at [phone] with questions or concerns regarding your invoice.   Our billing staff will not be able to assist you with questions regarding bills from these companies.  You will be contacted with the lab results as soon as they are available. The fastest way to get your results is to activate your My Chart account. Instructions are located on the last page of this paperwork. If you have not heard from [place] regarding the results in 2 weeks, please contact this office.      Tips for Eating Away From Home If You Have Diabetes Controlling your level of blood glucose, also known as blood sugar, can be challenging. It can be even more difficult when you do not prepare your own meals. The following tips can help you manage your diabetes when you eat away from home. Planning ahead Plan ahead if you know you will be eating away from home:  Ask your health care provider how to time meals and medicine if you are taking insulin.  Make a list of restaurants near you that offer healthy choices. If they have a carry-out menu, take it home and plan what you will order ahead of time.  Look up the restaurant you want to eat at online. Many chain and fast-food restaurants list nutritional information online. Use this information to choose the healthiest options and to calculate how many carbohydrates will be in your meal.  Use a carbohydrate-counting book or mobile app to look up the carbohydrate content and serving size of the foods you want to eat.  Become familiar with serving sizes and learn to recognize how many  servings are in a portion. This will allow you to estimate how many carbohydrates you can eat.  Free foods A "free food" is any food or drink that has less than 5 g of carbohydrates per serving. Free foods include:  Many vegetables.  Hard boiled eggs.  Nuts or seeds.  Olives.  Cheeses.  Meats.  These types of foods make good appetizer choices and are often available at salad bars. Lemon juice, vinegar, or a low-calorie salad dressing of fewer than 20 calories per serving can be used as a "free" salad dressing. Choices to reduce carbohydrates  Substitute nonfat sweetened yogurt with a sugar-free yogurt. Yogurt made from soy milk may also be used, but you will still want a sugar-free or plain option to choose a lower carbohydrate amount.  Ask your server to take away the bread basket or chips from your table.  Order fresh fruit. A salad bar often offers fresh fruit choices. Avoid canned fruit because it is usually packed in sugar or syrup.  Order a salad, and eat it without dressing. Or, create a "free" salad dressing.  Ask for substitutions. For example, instead of [place] fries, request an order of a vegetable such as salad, green beans, or broccoli. Other tips  If you take insulin, take the insulin once your food arrives to your table. This will ensure your insulin and food are timed correctly.  Ask your server  about the portion size before your order, and ask for a take-out box if the portion has more servings than you should have. When your food comes, leave the amount you should have on the plate, and put the rest in the take-out box.  Consider splitting an entree with someone and ordering a side salad. This information is not intended to replace advice given to you by your health care provider. Make sure you discuss any questions you have with your health care provider. Document Released: 07/28/2005 Document Revised: 01/03/2016 Document Reviewed: 10/25/2013 Elsevier  Interactive Patient Education  [employer].

## 2017-01-21 NOTE — Progress Notes (Signed)
Chief Complaint  Patient presents with  . Hypertension    f/u    HPI  Hypertension: Patient here for follow-up of elevated blood pressure. She is exercising and is adherent to low salt diet.  Blood pressure is well controlled at home. Cardiac symptoms none. Patient denies chest pain, claudication, exertional chest pressure/discomfort, fatigue and orthopnea.  Cardiovascular risk factors: diabetes mellitus, hypertension and obesity (BMI >= 30 kg/m2). Use of agents associated with hypertension: none. History of target organ damage: none. BP Readings from Last 3 Encounters:  01/21/17 133/79  12/17/16 (!) 145/82  11/05/16 (!) 150/91    Obesity: pt reports that she completed her nutrition course Reports that she is still working on her intake of food and making good choices She eats very small dinners She avoids calorie rich meals Wt Readings from Last 3 Encounters:  01/21/17 (!) 324 lb 3.2 oz (147.1 kg)  12/17/16 (!) 323 lb 12.8 oz (146.9 kg)  12/09/16 (!) 323 lb 14.4 oz (146.9 kg)    Diabetes Mellitus: Patient presents for follow up of diabetes. Symptoms: none. Symptoms have stabilized. Patient denies foot ulcerations, hyperglycemia, hypoglycemia , increase appetite, nausea and polyuria.  Evaluation to date has been included: fasting blood sugar.  Home sugars: patient does not check sugars.     Component            Latest Ref Rng & Units 11/05/2016 01/21/2017  Hemoglobin A1C      7.2 6.4    Past Medical History:  Diagnosis Date  . Allergy   . Anxiety   . Hypertension     Current Outpatient Prescriptions  Medication Sig Dispense Refill  . amLODipine (NORVASC) 10 MG tablet Take 1 tablet (10 mg total) by mouth daily. 90 tablet 3  . carvedilol (COREG) 12.5 MG tablet Take 1 tablet (12.5 mg total) by mouth 2 (two) times daily with a meal. 180 tablet 1  . cetirizine (ZYRTEC) 10 MG tablet Take 1 tablet (10 mg total) by mouth daily. 30 tablet 11  . fluticasone (FLONASE) 50 MCG/ACT  nasal spray Place 2 sprays into both nostrils daily. 16 g 6  . hydrochlorothiazide (HYDRODIURIL) 25 MG tablet Take 1 tablet (25 mg total) by mouth daily. 90 tablet 1  . losartan (COZAAR) 100 MG tablet Take 1 tablet (100 mg total) by mouth daily. 90 tablet 1  . metFORMIN (GLUCOPHAGE) 500 MG tablet Take 1 tablet (500 mg total) by mouth daily with breakfast. 180 tablet 1  . PARoxetine (PAXIL) 20 MG tablet Take 1 tablet (20 mg total) by mouth daily. 30 tablet 11   No current facility-administered medications for this visit.     Allergies:  Allergies  Allergen Reactions  . Sulfa Antibiotics [place]    History reviewed. No pertinent surgical history.  Social History   Social History  . Marital status: Single    Spouse name: N/A  . Number of children: N/A  . Years of education: N/A   Social History Main Topics  . Smoking status: Never Smoker  . Smokeless tobacco: Never Used  . Alcohol use 3.0 oz/week    5 Glasses of wine per week     Comment: glasses a month  . Drug use: No  . Sexual activity: Not Asked   Other Topics Concern  . None   Social History Narrative  . None    ROS See hpi  Objective: Vitals:   01/21/17 1616  BP: 133/79  Pulse: 77  Resp: 18  Temp: 98.9  F (37.2 C)  TempSrc: Oral  SpO2: 96%  Weight: (!) 324 lb 3.2 oz (147.1 kg)  Height: 5' 7.72" (1.72 m)    Physical Exam  Assessment and Plan [place] was seen today for hypertension.  Diagnoses and all orders for this visit:  Morbid obesity ([place])- stable  Continued encouragement and discussed strategies -     Lipid panel; Future  Type 2 diabetes mellitus without complication, without long-term current use of insulin ([place])- bp improved -     POCT glycosylated hemoglobin (Hb A1C) -     Lipid panel; Future  uncontrolled hypertension- will screen for dyslipidemia in pt with hypertension -     Lipid panel; Future  Refilled medications today -     carvedilol (COREG) 12.5 MG tablet; Take 1 tablet  (12.5 mg total) by mouth 2 (two) times daily with a meal.   A total of 30 minutes were spent face-to-face with the patient during this encounter and over half of that time was spent on counseling and coordination of care.   [place]

## 2017-07-26 ENCOUNTER — Other Ambulatory Visit: Payer: Self-pay | Admitting: Family Medicine

## 2017-07-29 ENCOUNTER — Encounter: Payer: BLUE CROSS/BLUE SHIELD | Admitting: Family Medicine

## 2017-07-29 ENCOUNTER — Telehealth: Payer: Self-pay | Admitting: Family Medicine

## 2017-07-29 NOTE — Telephone Encounter (Signed)
Copied from [place] [id number]. Topic: Quick Communication - Appointment Cancellation >> Jul 29, 2017  3:26 PM Taylor Yang, [place] wrote: Patient called to cancel appointment scheduled for 07/30/17 Patient  HAS HA rescheduled their appointment.    Route to department's PEC pool.

## 2017-07-30 ENCOUNTER — Encounter: Payer: Self-pay | Admitting: Family Medicine

## 2017-07-30 ENCOUNTER — Other Ambulatory Visit: Payer: Self-pay

## 2017-07-30 ENCOUNTER — Ambulatory Visit (INDEPENDENT_AMBULATORY_CARE_PROVIDER_SITE_OTHER): Payer: BLUE CROSS/BLUE SHIELD | Admitting: Family Medicine

## 2017-07-30 VITALS — BP 125/75 | HR 83 | Temp 98.7°F | Resp 18 | Ht 67.72 in | Wt 306.4 lb

## 2017-07-30 DIAGNOSIS — E119 Type 2 diabetes mellitus without complications: Secondary | ICD-10-CM

## 2017-07-30 DIAGNOSIS — I10 Essential (primary) hypertension: Secondary | ICD-10-CM

## 2017-07-30 DIAGNOSIS — Z0000 Encounter for general adult medical examination without abnormal findings: Secondary | ICD-10-CM

## 2017-07-30 DIAGNOSIS — Z23 Encounter for immunization: Secondary | ICD-10-CM | POA: Diagnosis not present

## 2017-07-30 MED ORDER — HYDROCHLOROTHIAZIDE 25 MG PO TABS: 25.0000 mg | ORAL_TABLET | Freq: Every day | ORAL | 1 refills | Status: DC

## 2017-07-30 MED ORDER — LOSARTAN POTASSIUM 100 MG PO TABS: ORAL_TABLET | ORAL | 1 refills | Status: DC

## 2017-07-30 MED ORDER — FLUTICASONE PROPIONATE 50 MCG/ACT NA SUSP: 2.0000 | Freq: Every day | NASAL | 6 refills | Status: DC

## 2017-07-30 MED ORDER — CETIRIZINE HCL 10 MG PO TABS: 10.0000 mg | ORAL_TABLET | Freq: Every day | ORAL | 11 refills | Status: DC

## 2017-07-30 MED ORDER — PAROXETINE HCL 20 MG PO TABS: 20.0000 mg | ORAL_TABLET | Freq: Every day | ORAL | 3 refills | Status: DC

## 2017-07-30 MED ORDER — METFORMIN HCL 500 MG PO TABS: 500.0000 mg | ORAL_TABLET | Freq: Every day | ORAL | 1 refills | Status: DC

## 2017-07-30 MED ORDER — CARVEDILOL 12.5 MG PO TABS: 12.5000 mg | ORAL_TABLET | Freq: Two times a day (BID) | ORAL | 1 refills | Status: DC

## 2017-07-30 NOTE — Assessment & Plan Note (Signed)
Will check a1c Well controlled, with diet, exercise and metformin

## 2017-07-30 NOTE — Progress Notes (Addendum)
Chief Complaint  Patient presents with  . Annual Exam    without pap    Subjective:  Taylor Yang is a 41 y.o. female here for a health maintenance visit.  Patient is established pt  Patient Active Problem List   Diagnosis Date Noted  . Controlled type 2 diabetes mellitus without complication, without long-term current use of insulin ([place]) 07/30/2017  . Acute nonseasonal allergic rhinitis due to pollen 07/24/2016  . Prediabetes 07/24/2016  . Morbid obesity ([place]) 07/24/2016  . Essential hypertension 07/24/2016    Past Medical History:  Diagnosis Date  . Allergy   . Anxiety   . Hypertension     History reviewed. No pertinent surgical history.   Outpatient Medications Prior to Visit  Medication Sig Dispense Refill  . amLODipine (NORVASC) 10 MG tablet Take 1 tablet (10 mg total) by mouth daily. 90 tablet 3  . carvedilol (COREG) 12.5 MG tablet Take 1 tablet (12.5 mg total) by mouth 2 (two) times daily with a meal. 180 tablet 1  . cetirizine (ZYRTEC) 10 MG tablet Take 1 tablet (10 mg total) by mouth daily. 30 tablet 11  . fluticasone (FLONASE) 50 MCG/ACT nasal spray Place 2 sprays into both nostrils daily. 16 g 6  . hydrochlorothiazide (HYDRODIURIL) 25 MG tablet Take 1 tablet (25 mg total) by mouth daily. 90 tablet 1  . losartan (COZAAR) 100 MG tablet TAKE 1 TABLET(100 MG) BY MOUTH DAILY 90 tablet 0  . metFORMIN (GLUCOPHAGE) 500 MG tablet Take 1 tablet (500 mg total) by mouth daily with breakfast. 180 tablet 1  . PARoxetine (PAXIL) 20 MG tablet Take 1 tablet (20 mg total) by mouth daily. 30 tablet 11   No facility-administered medications prior to visit.     Allergies  Allergen Reactions  . Sulfa Antibiotics [place]     Family History  Problem Relation Age of Onset  . Hyperlipidemia Mother   . Hypertension Mother   . Hyperlipidemia Maternal Grandmother   . Hypertension Maternal Grandmother   . Hypertension Paternal Grandmother   . Hyperlipidemia Paternal Grandmother       Health Habits: Dental Exam: up to date Eye Exam: up to date Exercise: 7 times/week on average Current exercise activities: walking/running Diet: ADA  Social History   Socioeconomic History  . Marital status: Single    Spouse name: Not on file  . Number of children: Not on file  . Years of education: Not on file  . Highest education level: Not on file  Social Needs  . Financial resource strain: Not on file  . Food insecurity - worry: Not on file  . Food insecurity - inability: Not on file  . Transportation needs - medical: Not on file  . Transportation needs - non-medical: Not on file  Occupational History  . Not on file  Tobacco Use  . Smoking status: Never Smoker  . Smokeless tobacco: Never Used  Substance and Sexual Activity  . Alcohol use: Yes    Alcohol/week: 3.0 oz    Types: 5 Glasses of wine per week    Comment: glasses a month  . Drug use: No  . Sexual activity: Not on file  Other Topics Concern  . Not on file  Social History Narrative  . Not on file   Social History   Substance and Sexual Activity  Alcohol Use Yes  . Alcohol/week: 3.0 oz  . Types: 5 Glasses of wine per week   Comment: glasses a month   Social History  Tobacco Use  Smoking Status Never Smoker  Smokeless Tobacco Never Used   Social History   Substance and Sexual Activity  Drug Use No    GYN: Sexual Health Menstrual status: regular menses LMP: Patient's last menstrual period was 07/14/2017. Last pap smear: see HM section History of abnormal pap smears:  Sexually active:  with female partner Current contraception: ocps  Health Maintenance: See under health Maintenance activity for review of completion dates as well. Immunization History  Administered Date(s) Administered  . Influenza,inj,Quad PF,6+ Mos 09/03/2016, 07/30/2017  . Pneumococcal Polysaccharide-23 11/05/2016      Depression Screen-PHQ2/9 Depression screen [facility] 2/9 07/30/2017 01/21/2017 12/23/2016 12/17/2016  12/16/2016  Decreased Interest 0 0 0 0 0  Down, Depressed, Hopeless 0 0 0 0 0  PHQ - 2 Score 0 0 0 0 0       Depression Severity and Treatment Recommendations:  0-4= None  5-9= Mild / Treatment: Support, educate to call if worse; return in one month  10-14= Moderate / Treatment: Support, watchful waiting; Antidepressant or Psycotherapy  15-19= Moderately severe / Treatment: Antidepressant OR Psychotherapy  >= 20 = Major depression, severe / Antidepressant AND Psychotherapy    Review of Systems   Review of Systems  Constitutional: Negative for chills and fever.  Eyes: Negative for blurred vision and double vision.  Respiratory: Negative for cough, shortness of breath and wheezing.   Cardiovascular: Negative for chest pain and palpitations.  Gastrointestinal: Negative for abdominal pain, diarrhea, nausea and vomiting.  Genitourinary: Negative for dysuria and urgency.  Skin: Negative for itching and [place].  Neurological: Negative for dizziness and headaches.  Psychiatric/Behavioral: Negative for depression and hallucinations.     See HPI for ROS as well.    Objective:   Vitals:   07/30/17 1500 07/30/17 1554  BP: 125/75   Pulse: (!) 111 83  Resp: 18   Temp: 98.7 F (37.1 C)   TempSrc: Oral   SpO2: 95%   Weight: (!) 306 lb 6.4 oz (139 kg)   Height: 5' 7.72" (1.72 m)    Wt Readings from Last 3 Encounters:  07/30/17 (!) 306 lb 6.4 oz (139 kg)  01/21/17 (!) 324 lb 3.2 oz (147.1 kg)  12/17/16 (!) 323 lb 12.8 oz (146.9 kg)    Body mass index is 46.97 kg/m.  Physical Exam  BP 125/75 (BP Location: Left Arm, Patient Position: Sitting, Cuff Size: Large)   Pulse 83   Temp 98.7 F (37.1 C) (Oral)   Resp 18   Ht 5' 7.72" (1.72 m)   Wt (!) 306 lb 6.4 oz (139 kg)   LMP 07/14/2017   SpO2 95%   BMI 46.97 kg/m   General Appearance:    Alert, cooperative, no distress, appears stated age  Head:    Normocephalic, without obvious abnormality, atraumatic  Eyes:    PERRL,  conjunctiva/corneas clear, EOM's intact, fundi    benign, both eyes  Ears:    Normal TM's and external ear canals, both ears  Nose:   Nares normal, septum midline, mucosa normal, no drainage    or sinus tenderness  Throat:   Lips, mucosa, and tongue normal; teeth and gums normal  Neck:   Supple, symmetrical, trachea midline, no adenopathy;    thyroid:  no enlargement/tenderness/nodules; no carotid   bruit or JVD  Back:     Symmetric, no curvature, ROM normal, no CVA tenderness  Lungs:     Clear to auscultation bilaterally, respirations unlabored  Chest Wall:  No tenderness or deformity   Heart:    Regular rate and rhythm, S1 and S2 normal, no murmur, rub   or gallop  Breast Exam:     Chaperone present, No tenderness, masses, or nipple abnormality  Abdomen:     Soft, non-tender, bowel sounds active all four quadrants,    no masses, no organomegaly  Genitalia:    Deferred. Pt has pap scheduled  Extremities:   Extremities normal, atraumatic, no cyanosis or edema  Pulses:   2+ and symmetric all extremities  Skin:   Skin color, texture, turgor normal, no rashes or lesions  Lymph nodes:   Cervical, supraclavicular, and axillary nodes normal  Neurologic:   CNII-XII intact, normal strength, sensation and reflexes    throughout      Assessment/Plan:   Patient was seen for a health maintenance exam.  Counseled the patient on health maintenance issues. Reviewed her health mainteance schedule and ordered appropriate tests (see orders.) Counseled on regular exercise and weight management. Recommend regular eye exams and dental cleaning.   The following issues were addressed today for health maintenance:   Return in about 4 weeks (around 08/27/2017) for bp check.  Problem List Items Addressed This Visit      Cardiovascular and Mediastinum   Essential hypertension    With weight loss bp is well controlled Will do drug holiday from amlodipine      Relevant Medications   carvedilol  (COREG) 12.5 MG tablet   losartan (COZAAR) 100 MG tablet   hydrochlorothiazide (HYDRODIURIL) 25 MG tablet     Endocrine   Controlled type 2 diabetes mellitus without complication, without long-term current use of insulin (HCC)    Will check a1c Well controlled, with diet, exercise and metformin      Relevant Medications   losartan (COZAAR) 100 MG tablet   metFORMIN (GLUCOPHAGE) 500 MG tablet   Other Relevant Orders   Lipid panel   Hemoglobin A1c   Comprehensive metabolic panel   TSH     Other   Morbid obesity ([place])    Improved with daily exercise      Relevant Medications   metFORMIN (GLUCOPHAGE) 500 MG tablet    Other Visit Diagnoses    Annual physical exam    -  Primary   Need for prophylactic vaccination and inoculation against influenza       Relevant Orders   Flu Vaccine QUAD 36+ mos IM (Completed)       Body mass index is 46.97 kg/m.:  Discussed the patient's BMI with patient. The BMI body mass index is 46.97 kg/m.     No future appointments.  Patient Instructions   Drug Holiday 1. Stop amlodipine for one month then return for blood pressure check 2. With your home monitor only check your blood pressure AFTER you have stopped for a week. And only once a week.      IF you received an x-ray today, you will receive an invoice from [facility]. Please contact [facility] at [phone] with questions or concerns regarding your invoice.   IF you received labwork today, you will receive an invoice from [facility]. Please contact [facility] at [phone] with questions or concerns regarding your invoice.   Our billing staff will not be able to assist you with questions regarding bills from these companies.  You will be contacted with the lab results as soon as they are available. The fastest way to get your results is to activate your My Chart account. Instructions  are located on the last page of this paperwork. If you have not heard from [place]  regarding the results in 2 weeks, please contact this office.

## 2017-07-30 NOTE — Assessment & Plan Note (Signed)
Improved with daily exercise

## 2017-07-30 NOTE — Patient Instructions (Addendum)
Drug Holiday 1. Stop amlodipine for one month then return for blood pressure check 2. With your home monitor only check your blood pressure AFTER you have stopped for a week. And only once a week.      IF you received an x-ray today, you will receive an invoice from [facility]. Please contact [facility] at [phone] with questions or concerns regarding your invoice.   IF you received labwork today, you will receive an invoice from [place]. Please contact [facility] at [phone] with questions or concerns regarding your invoice.   Our billing staff will not be able to assist you with questions regarding bills from these companies.  You will be contacted with the lab results as soon as they are available. The fastest way to get your results is to activate your My Chart account. Instructions are located on the last page of this paperwork. If you have not heard from [place] regarding the results in 2 weeks, please contact this office.

## 2017-07-30 NOTE — Assessment & Plan Note (Signed)
With weight loss bp is well controlled Will do drug holiday from amlodipine

## 2017-07-31 LAB — COMPREHENSIVE METABOLIC PANEL
A/G RATIO: 1.8 (ref 1.2–2.2)
ALT: 29 IU/L (ref 0–32)
AST: 19 IU/L (ref 0–40)
Albumin: 4.6 g/dL (ref 3.5–5.5)
Alkaline Phosphatase: 111 IU/L (ref 39–117)
BUN/Creatinine Ratio: 17 (ref 9–23)
BUN: 12 mg/dL (ref 6–20)
Bilirubin Total: 0.6 mg/dL (ref 0.0–1.2)
CALCIUM: 9.5 mg/dL (ref 8.7–10.2)
CHLORIDE: 100 mmol/L (ref 96–106)
CO2: 24 mmol/L (ref 20–29)
Creatinine, Ser: 0.69 mg/dL (ref 0.57–1.00)
GFR calc Af Amer: 127 mL/min/{1.73_m2} (ref >59)
GFR, EST NON AFRICAN AMERICAN: 110 mL/min/{1.73_m2} (ref >59)
GLUCOSE: 122 mg/dL — AB (ref 65–99)
Globulin, Total: 2.5 g/dL (ref 1.5–4.5)
POTASSIUM: 4 mmol/L (ref 3.5–5.2)
Sodium: 140 mmol/L (ref 134–144)
Total Protein: 7.1 g/dL (ref 6.0–8.5)

## 2017-07-31 LAB — LIPID PANEL
Chol/HDL Ratio: 4.6 ratio — ABNORMAL HIGH (ref 0.0–4.4)
Cholesterol, Total: 210 mg/dL — ABNORMAL HIGH (ref 100–199)
HDL: 46 mg/dL (ref >39)
LDL Calculated: 120 mg/dL — ABNORMAL HIGH (ref 0–99)
Triglycerides: 220 mg/dL — ABNORMAL HIGH (ref 0–149)
VLDL CHOLESTEROL CAL: 44 mg/dL — AB (ref 5–40)

## 2017-07-31 LAB — HEMOGLOBIN A1C
ESTIMATED AVERAGE GLUCOSE: 140 mg/dL
HEMOGLOBIN A1C: 6.5 % — AB (ref 4.8–5.6)

## 2017-07-31 LAB — TSH: TSH: 1.34 u[IU]/mL (ref 0.450–4.500)

## 2017-08-01 MED ORDER — ATORVASTATIN CALCIUM 10 MG PO TABS: 10.0000 mg | ORAL_TABLET | Freq: Every day | ORAL | 3 refills | Status: DC

## 2017-08-01 NOTE — Addendum Note (Signed)
Addended by: Delia Chimes A on: 08/01/2017 11:02 AM   Modules accepted: Orders

## 2017-09-02 ENCOUNTER — Encounter: Payer: Self-pay | Admitting: Family Medicine

## 2017-09-02 ENCOUNTER — Other Ambulatory Visit: Payer: Self-pay

## 2017-09-02 ENCOUNTER — Ambulatory Visit: Payer: Managed Care, Other (non HMO) | Admitting: Family Medicine

## 2017-09-02 VITALS — BP 120/80 | HR 87 | Temp 98.7°F | Resp 18 | Ht 67.72 in | Wt 311.6 lb

## 2017-09-02 DIAGNOSIS — H109 Unspecified conjunctivitis: Secondary | ICD-10-CM

## 2017-09-02 DIAGNOSIS — B9689 Other specified bacterial agents as the cause of diseases classified elsewhere: Secondary | ICD-10-CM

## 2017-09-02 MED ORDER — POLYMYXIN B-TRIMETHOPRIM 10000-0.1 UNIT/ML-% OP SOLN: 1.0000 [drp] | OPHTHALMIC | 0 refills | Status: AC

## 2017-09-02 NOTE — Patient Instructions (Addendum)
Bacterial Conjunctivitis Bacterial conjunctivitis is an infection of the clear membrane that covers the white part of your eye and the inner surface of your eyelid (conjunctiva). When the blood vessels in your conjunctiva become inflamed, your eye becomes red or pink, and it will probably feel itchy. Bacterial conjunctivitis spreads very easily from person to person (is contagious). It also spreads easily from one eye to the other eye. What are the causes? This condition is caused by several common bacteria. You may get the infection if you come into close contact with another person who is infected. You may also come into contact with items that are contaminated with the bacteria, such as a face towel, contact lens solution, or eye makeup. What increases the risk? This condition is more likely to develop in people who:  Are exposed to other people who have the infection.  Wear contact lenses.  Have a sinus infection.  Have had a recent eye injury or surgery.  Have a weak body defense system (immune system).  Have a medical condition that causes dry eyes.  What are the signs or symptoms? Symptoms of this condition include:  Eye redness.  Tearing or watery eyes.  Itchy eyes.  Burning feeling in your eyes.  Thick, yellowish discharge from an eye. This may turn into a crust on the eyelid overnight and cause your eyelids to stick together.  Swollen eyelids.  Blurred vision.  How is this diagnosed? Your health care provider can diagnose this condition based on your symptoms and medical history. Your health care provider may also take a sample of discharge from your eye to find the cause of your infection. This is rarely done. How is this treated? Treatment for this condition includes:  Antibiotic eye drops or ointment to clear the infection more quickly and prevent the spread of infection to others.  Oral antibiotic medicines to treat infections that do not respond to drops or  ointments, or last longer than 10 days.  Cool, wet cloths (cool compresses) placed on the eyes.  Artificial tears applied 2-6 times a day.  Follow these instructions at home: Medicines  Take or apply your antibiotic medicine as told by your health care provider. Do not stop taking or applying the antibiotic even if you start to feel better.  Take or apply over-the-counter and prescription medicines only as told by your health care provider.  Be very careful to avoid touching the edge of your eyelid with the eye drop bottle or the ointment tube when you apply medicines to the affected eye. This will keep you from spreading the infection to your other eye or to other people. Managing discomfort  Gently wipe away any drainage from your eye with a warm, wet washcloth or a cotton ball.  Apply a cool, clean washcloth to your eye for 10-20 minutes, 3-4 times a day. General instructions  Do not wear contact lenses until the inflammation is gone and your health care provider says it is safe to wear them again. Ask your health care provider how to sterilize or replace your contact lenses before you use them again. Wear glasses until you can resume wearing contacts.  Avoid wearing eye makeup until the inflammation is gone. Throw away any old eye cosmetics that may be contaminated.  Change or wash your pillowcase every day.  Do not share towels or washcloths. This may spread the infection.  Wash your hands often with soap and water. Use paper towels to dry your hands.  Avoid   touching or rubbing your eyes.  Do not drive or use heavy machinery if your vision is blurred. Contact a health care provider if:  You have a fever.  Your symptoms do not get better after 10 days. Get help right away if:  You have a fever and your symptoms suddenly get worse.  You have severe pain when you move your eye.  You have facial pain, redness, or swelling.  You have sudden loss of vision. This  information is not intended to replace advice given to you by your health care provider. Make sure you discuss any questions you have with your health care provider. Document Released: 07/28/2005 Document Revised: 12/06/2015 Document Reviewed: 05/10/2015 Elsevier Interactive Patient Education  2017 [employer].     IF you received an x-ray today, you will receive an invoice from [facility]. Please contact [facility] at [phone] with questions or concerns regarding your invoice.   IF you received labwork today, you will receive an invoice from [place]. Please contact [facility] at [phone] with questions or concerns regarding your invoice.   Our billing staff will not be able to assist you with questions regarding bills from these companies.  You will be contacted with the lab results as soon as they are available. The fastest way to get your results is to activate your My Chart account. Instructions are located on the last page of this paperwork. If you have not heard from [place] regarding the results in 2 weeks, please contact this office.

## 2017-09-02 NOTE — Progress Notes (Signed)
Chief Complaint  Patient presents with  . Conjunctivitis    onset: Saturday 08/29/17 in left eye and then move to right eye, matted shut in the mornings and gooey/watery.  Pain in the left eye none in right eye    HPI  Pt reports that she has been having irritation of the eyes  Symptoms started on 08/29/17 States that the eye is irritated Her god son also had pink eye No pain with eye movement Bright lights are irritating No fevers or chills No decreased visual acuity    Past Medical History:  Diagnosis Date  . Allergy   . Anxiety   . Hypertension     Current Outpatient Medications  Medication Sig Dispense Refill  . atorvastatin (LIPITOR) 10 MG tablet Take 1 tablet (10 mg total) by mouth daily. 90 tablet 3  . carvedilol (COREG) 12.5 MG tablet Take 1 tablet (12.5 mg total) by mouth 2 (two) times daily with a meal. 180 tablet 1  . cetirizine (ZYRTEC) 10 MG tablet Take 1 tablet (10 mg total) by mouth daily. 30 tablet 11  . fluticasone (FLONASE) 50 MCG/ACT nasal spray Place 2 sprays into both nostrils daily. 16 g 6  . hydrochlorothiazide (HYDRODIURIL) 25 MG tablet Take 1 tablet (25 mg total) by mouth daily. 90 tablet 1  . losartan (COZAAR) 100 MG tablet TAKE 1 TABLET(100 MG) BY MOUTH DAILY 90 tablet 1  . metFORMIN (GLUCOPHAGE) 500 MG tablet Take 1 tablet (500 mg total) by mouth daily with breakfast. 180 tablet 1  . PARoxetine (PAXIL) 20 MG tablet Take 1 tablet (20 mg total) by mouth daily. 90 tablet 3  . trimethoprim-polymyxin b (POLYTRIM) ophthalmic solution Place 1 drop into both eyes every 4 (four) hours for 10 days. 10 mL 0   No current facility-administered medications for this visit.     Allergies:  Allergies  Allergen Reactions  . Sulfa Antibiotics [place]    No past surgical history on file.  Social History   Socioeconomic History  . Marital status: Single    Spouse name: None  . Number of children: None  . Years of education: None  . Highest education level:  None  Social Needs  . Financial resource strain: None  . Food insecurity - worry: None  . Food insecurity - inability: None  . Transportation needs - medical: None  . Transportation needs - non-medical: None  Occupational History  . None  Tobacco Use  . Smoking status: Never Smoker  . Smokeless tobacco: Never Used  Substance and Sexual Activity  . Alcohol use: Yes    Alcohol/week: 3.0 oz    Types: 5 Glasses of wine per week    Comment: glasses a month  . Drug use: No  . Sexual activity: None  Other Topics Concern  . None  Social History Narrative  . None    Family History  Problem Relation Age of Onset  . Hyperlipidemia Mother   . Hypertension Mother   . Hyperlipidemia Maternal Grandmother   . Hypertension Maternal Grandmother   . Hypertension Paternal Grandmother   . Hyperlipidemia Paternal Grandmother      ROS Review of Systems See HPI Constitution: No fevers or chills No malaise No diaphoresis Skin: No [place] or itching Eyes: see hpi GU: no dysuria or hematuria Neuro: no dizziness or headaches  all others reviewed and negative   Objective: Vitals:   09/02/17 1331  BP: 120/80  Pulse: 87  Resp: 18  Temp: 98.7 F (37.1 C)  TempSrc: Oral  SpO2: 96%  Weight: (!) 311 lb 9.6 oz (141.3 kg)  Height: 5' 7.72" (1.72 m)    Physical Exam General: alert, oriented, in NAD Head: normocephalic, atraumatic, no sinus tenderness Eyes: EOM intact, no scleral icterus, ++conjunctival injection with redness and drainage from the eye Ears: TM clear bilaterally Nose: mucosa nonerythematous, nonedematous Throat: no pharyngeal exudate or erythema Lymph: no posterior auricular, submental or cervical lymph adenopathy Heart: normal rate, normal sinus rhythm, no murmurs Lungs: clear to auscultation bilaterally, no wheezing   Assessment and Plan Cheryal was seen today for conjunctivitis.  Diagnoses and all orders for this visit:  Bacterial conjunctivitis of both  eyes  Contact precautions Advised meds  Cool compress Work note given with return of Monday 09/07/17 -     trimethoprim-polymyxin b (POLYTRIM) ophthalmic solution; Place 1 drop into both eyes every 4 (four) hours for 10 days.     [place]

## 2017-09-10 ENCOUNTER — Other Ambulatory Visit: Payer: Self-pay | Admitting: Family Medicine

## 2017-09-10 DIAGNOSIS — E039 Hypothyroidism, unspecified: Secondary | ICD-10-CM

## 2017-09-10 NOTE — Progress Notes (Signed)
Ordered tsh and free t4

## 2017-11-19 ENCOUNTER — Telehealth: Payer: Self-pay | Admitting: Family Medicine

## 2017-11-19 NOTE — Telephone Encounter (Signed)
Called and left VM per DPR for pt to call and reschedule her appt with Dr. Nolon Rod on 02/03/18. When pt calls back, please reschedule her with Dr. Nolon Rod at her convenience.

## 2017-12-25 ENCOUNTER — Telehealth: Payer: Self-pay | Admitting: Family Medicine

## 2017-12-25 NOTE — Telephone Encounter (Signed)
mychart message sent to pt about changing apt on 6/27 with Dr Nolon Rod

## 2018-01-20 ENCOUNTER — Encounter: Payer: Self-pay | Admitting: Family Medicine

## 2018-01-27 ENCOUNTER — Ambulatory Visit: Payer: Managed Care, Other (non HMO) | Admitting: Family Medicine

## 2018-01-27 ENCOUNTER — Other Ambulatory Visit: Payer: Self-pay

## 2018-01-27 ENCOUNTER — Encounter: Payer: Self-pay | Admitting: Family Medicine

## 2018-01-27 VITALS — BP 150/92 | HR 83 | Temp 99.3°F | Resp 18 | Ht 67.72 in | Wt 319.8 lb

## 2018-01-27 DIAGNOSIS — F411 Generalized anxiety disorder: Secondary | ICD-10-CM | POA: Diagnosis not present

## 2018-01-27 DIAGNOSIS — I10 Essential (primary) hypertension: Secondary | ICD-10-CM

## 2018-01-27 DIAGNOSIS — E119 Type 2 diabetes mellitus without complications: Secondary | ICD-10-CM

## 2018-01-27 DIAGNOSIS — D229 Melanocytic nevi, unspecified: Secondary | ICD-10-CM | POA: Diagnosis not present

## 2018-01-27 DIAGNOSIS — E039 Hypothyroidism, unspecified: Secondary | ICD-10-CM | POA: Diagnosis not present

## 2018-01-27 LAB — POCT GLYCOSYLATED HEMOGLOBIN (HGB A1C): HEMOGLOBIN A1C: 7.7 % — AB (ref 4.0–5.6)

## 2018-01-27 MED ORDER — BUSPIRONE HCL 5 MG PO TABS: 5.0000 mg | ORAL_TABLET | Freq: Two times a day (BID) | ORAL | 0 refills | Status: DC

## 2018-01-27 MED ORDER — GLUCOSE BLOOD VI STRP: ORAL_STRIP | 12 refills | Status: AC

## 2018-01-27 NOTE — Progress Notes (Signed)
Chief Complaint  Patient presents with  . skin biopsy of changing mole ( on back)    noticed one month ago, not necessarily a mole but a place on back that has not healed, initially thought it was ringworm  . Anxiety  . Diabetes    HPI   Anxiety GAD 7 : Generalized Anxiety Score 01/27/2018  Nervous, Anxious, on Edge 2  Control/stop worrying 2  Worry too much - different things 3  Trouble relaxing 1  Restless 0  Easily annoyed or irritable 2  Afraid - awful might happen 1  Total GAD 7 Score 11  Anxiety Difficulty Somewhat difficult    Pt reports that she has been on Paxil for years but continues to have anxiety symptoms She reports that she can usually talk herself out of the anxiety  She has been on no other kind of medication  She states that she feels like it helped in the past but now  She reports that she had one episode of Paxil withdrawal and for her it felt   Diabetes Diabetes Mellitus: Patient presents for follow up of diabetes. Symptoms: none.  Patient denies hyperglycemia, hypoglycemia , increase appetite, nausea, paresthesia of the feet, polydipsia, polyuria, visual disturbances and vomitting.  Evaluation to date has been included: hemoglobin A1C.  Home sugars: patient does not check sugars. Treatment to date: Continued metformin which has been effective.  She is taking metformin with dinner She states that she has not been exercising as much Wt Readings from Last 3 Encounters:  01/27/18 (!) 319 lb 12.8 oz (145.1 kg)  09/02/17 (!) 311 lb 9.6 oz (141.3 kg)  07/30/17 (!) 306 lb 6.4 oz (139 kg)    Lab Results  Component Value Date   HGBA1C 7.7 (A) 01/27/2018    She reports an unusual mole in her low back with irregular borders She denies history of skin cancer She is a nonsmoker  Past Medical History:  Diagnosis Date  . Allergy   . Anxiety   . Hypertension     Current Outpatient Medications  Medication Sig Dispense Refill  . atorvastatin (LIPITOR) 10  MG tablet Take 1 tablet (10 mg total) by mouth daily. 90 tablet 3  . carvedilol (COREG) 12.5 MG tablet Take 1 tablet (12.5 mg total) by mouth 2 (two) times daily with a meal. 180 tablet 1  . cetirizine (ZYRTEC) 10 MG tablet Take 1 tablet (10 mg total) by mouth daily. 30 tablet 11  . fluticasone (FLONASE) 50 MCG/ACT nasal spray Place 2 sprays into both nostrils daily. 16 g 6  . hydrochlorothiazide (HYDRODIURIL) 25 MG tablet Take 1 tablet (25 mg total) by mouth daily. 90 tablet 1  . losartan (COZAAR) 100 MG tablet TAKE 1 TABLET(100 MG) BY MOUTH DAILY 90 tablet 1  . metFORMIN (GLUCOPHAGE) 500 MG tablet Take 1 tablet (500 mg total) by mouth daily with breakfast. 180 tablet 1  . PARoxetine (PAXIL) 20 MG tablet Take 1 tablet (20 mg total) by mouth daily. 90 tablet 3  . busPIRone (BUSPAR) 5 MG tablet Take 1 tablet (5 mg total) by mouth 2 (two) times daily. 60 tablet 0  . glucose blood test strip Use to check blood glucose daily and as needed. Use as instructed. E11.9 100 each 12   No current facility-administered medications for this visit.     Allergies:  Allergies  Allergen Reactions  . Sulfa Antibiotics [place]    No past surgical history on file.  Social History  Socioeconomic History  . Marital status: Single    Spouse name: Not on file  . Number of children: Not on file  . Years of education: Not on file  . Highest education level: Not on file  Occupational History  . Not on file  Social Needs  . Financial resource strain: Not on file  . Food insecurity:    Worry: Not on file    Inability: Not on file  . Transportation needs:    Medical: Not on file    Non-medical: Not on file  Tobacco Use  . Smoking status: Never Smoker  . Smokeless tobacco: Never Used  Substance and Sexual Activity  . Alcohol use: Yes    Alcohol/week: 3.0 oz    Types: 5 Glasses of wine per week    Comment: glasses a month  . Drug use: No  . Sexual activity: Not on file  Lifestyle  . Physical  activity:    Days per week: Not on file    Minutes per session: Not on file  . Stress: Not on file  Relationships  . Social connections:    Talks on phone: Not on file    Gets together: Not on file    Attends religious service: Not on file    Active member of club or organization: Not on file    Attends meetings of clubs or organizations: Not on file    Relationship status: Not on file  Other Topics Concern  . Not on file  Social History Narrative  . Not on file    Family History  Problem Relation Age of Onset  . Hyperlipidemia Mother   . Hypertension Mother   . Hyperlipidemia Maternal Grandmother   . Hypertension Maternal Grandmother   . Hypertension Paternal Grandmother   . Hyperlipidemia Paternal Grandmother      ROS Review of Systems See HPI Constitution: No fevers or chills No malaise No diaphoresis Skin: No [place] or itching Eyes: no blurry vision, no double vision GU: no dysuria or hematuria Neuro: no dizziness or headaches all others reviewed and negative   Objective: Vitals:   01/27/18 1004 01/27/18 1119  BP: (!) 150/98 (!) 150/92  Pulse: 84 83  Resp: 18   Temp: 99.3 F (37.4 C)   TempSrc: Oral   SpO2: 94%   Weight: (!) 319 lb 12.8 oz (145.1 kg)   Height: 5' 7.72" (1.72 m)    BP Readings from Last 3 Encounters:  01/27/18 (!) 150/92  09/02/17 120/80  07/30/17 125/75     Physical Exam  Physical Exam  Constitutional: She is oriented to person, place, and time. She appears well-developed and well-nourished.  HENT:  Head: Normocephalic and atraumatic.  Eyes: Conjunctivae and EOM are normal.  Cardiovascular: Normal rate, regular rhythm and normal heart sounds.   Pulmonary/Chest: Effort normal and breath sounds normal. No respiratory distress. She has no wheezes.  Abdominal: Normal appearance and bowel sounds are normal. There is no tenderness. There is no CVA tenderness.  Neurological: She is alert and oriented to person, place, and time.        Assessment and Plan Rodolfo was seen today for skin biopsy of changing mole ( on back), anxiety and diabetes.  Diagnoses and all orders for this visit:  GAD (generalized anxiety disorder)- will add bupsar and if that does not work will change Paxil -     busPIRone (BUSPAR) 5 MG tablet; Take 1 tablet (5 mg total) by mouth 2 (two) times daily.  Essential hypertension-  bp elevated today, concerning for stress -     Lipid panel  Acquired hypothyroidism Thyroid stable Will monitor tsh Continue current dose of levothyroxine Discussed that any changes would require a earlier check Discussed that thyroid medication should always be taken on an empty stomach -     Lipid panel -     TSH  Type 2 diabetes mellitus without complication, without long-term current use of insulin (HCC) Pt a1c shows deterioration  -     Lipid panel -     POCT glycosylated hemoglobin (Hb A1C) -     Comprehensive metabolic panel  Atypical nevi- performed punch biopsy -     Dermatology pathology  Other orders -     glucose blood test strip; Use to check blood glucose daily and as needed. Use as instructed. E11.9    Procedure Area was cleaned and prepped. With lidcaine 2% anesthesia was achieved. Punch biopsy was performed. Sent for pathology.      [place]

## 2018-01-27 NOTE — Patient Instructions (Addendum)
     IF you received an x-ray today, you will receive an invoice from [facility]. Please contact [facility] at [phone] with questions or concerns regarding your invoice.   IF you received labwork today, you will receive an invoice from [facility]. Please contact [facility] at [phone] with questions or concerns regarding your invoice.   Our billing staff will not be able to assist you with questions regarding bills from these companies.  You will be contacted with the lab results as soon as they are available. The fastest way to get your results is to activate your My Chart account. Instructions are located on the last page of this paperwork. If you have not heard from [place] regarding the results in 2 weeks, please contact this office.     Skin Biopsy, Care After Refer to this sheet in the next few weeks. These instructions provide you with information about caring for yourself after your procedure. Your health care provider may also give you more specific instructions. Your treatment has been planned according to current medical practices, but problems sometimes occur. Call your health care provider if you have any problems or questions after your procedure. What can I expect after the procedure? After the procedure, it is common to have:  Soreness.  Bruising.  Itching.  Follow these instructions at home:  Rest and then return to your normal activities as told by your health care provider.  Take over-the-counter and prescription medicines only as told by your health care provider.  Follow instructions from your health care provider about how to take care of your biopsy site.Make sure you: ? Wash your hands with soap and water before you change your bandage (dressing). If soap and water are not available, use hand sanitizer. ? Change your dressing as told by your health care provider. ? Leave stitches (sutures), skin glue, or adhesive strips in place. These  skin closures may need to stay in place for 2 weeks or longer. If adhesive strip edges start to loosen and curl up, you may trim the loose edges. Do not remove adhesive strips completely unless your health care provider tells you to do that. If the biopsy area bleeds, apply gentle pressure for 10 minutes.  Check your biopsy site every day for signs of infection. Check for: ? More redness, swelling, or pain. ? More fluid or blood. ? Warmth. ? Pus or a bad smell.  Keep all follow-up visits as told by your health care provider. This is important. Contact a health care provider if:  You have more redness, swelling, or pain around your biopsy site.  You have more fluid or blood coming from your biopsy site.  Your biopsy site feels warm to the touch.  You have pus or a bad smell coming from your biopsy site.  You have a fever. Get help right away if:  You have bleeding that does not stop with pressure or a dressing. This information is not intended to replace advice given to you by your health care provider. Make sure you discuss any questions you have with your health care provider. Document Released: 08/24/2015 Document Revised: 03/23/2016 Document Reviewed: 10/25/2014 Elsevier Interactive Patient Education  [employer].

## 2018-01-28 LAB — COMPREHENSIVE METABOLIC PANEL
A/G RATIO: 1.3 (ref 1.2–2.2)
ALK PHOS: 96 IU/L (ref 39–117)
ALT: 25 IU/L (ref 0–32)
AST: 28 IU/L (ref 0–40)
Albumin: 4.1 g/dL (ref 3.5–5.5)
BILIRUBIN TOTAL: 0.6 mg/dL (ref 0.0–1.2)
BUN / CREAT RATIO: 15 (ref 9–23)
BUN: 11 mg/dL (ref 6–24)
CHLORIDE: 97 mmol/L (ref 96–106)
CO2: 24 mmol/L (ref 20–29)
Calcium: 9.5 mg/dL (ref 8.7–10.2)
Creatinine, Ser: 0.73 mg/dL (ref 0.57–1.00)
GFR calc non Af Amer: 103 mL/min/{1.73_m2} (ref >59)
GFR, EST AFRICAN AMERICAN: 119 mL/min/{1.73_m2} (ref >59)
GLUCOSE: 150 mg/dL — AB (ref 65–99)
Globulin, Total: 3.2 g/dL (ref 1.5–4.5)
POTASSIUM: 4.6 mmol/L (ref 3.5–5.2)
Sodium: 139 mmol/L (ref 134–144)
TOTAL PROTEIN: 7.3 g/dL (ref 6.0–8.5)

## 2018-01-28 LAB — LIPID PANEL
CHOL/HDL RATIO: 4 ratio (ref 0.0–4.4)
Cholesterol, Total: 158 mg/dL (ref 100–199)
HDL: 40 mg/dL (ref >39)
LDL CALC: 70 mg/dL (ref 0–99)
Triglycerides: 239 mg/dL — ABNORMAL HIGH (ref 0–149)
VLDL Cholesterol Cal: 48 mg/dL — ABNORMAL HIGH (ref 5–40)

## 2018-01-28 LAB — TSH: TSH: 1.39 u[IU]/mL (ref 0.450–4.500)

## 2018-02-02 ENCOUNTER — Encounter: Payer: Self-pay | Admitting: Family Medicine

## 2018-02-02 NOTE — Addendum Note (Signed)
Addended by: Delia Chimes A on: 02/02/2018 12:33 PM   Modules accepted: Orders

## 2018-02-03 ENCOUNTER — Ambulatory Visit: Payer: BLUE CROSS/BLUE SHIELD | Admitting: Family Medicine

## 2018-02-03 ENCOUNTER — Encounter: Payer: Self-pay | Admitting: Family Medicine

## 2018-02-04 ENCOUNTER — Ambulatory Visit: Payer: Managed Care, Other (non HMO) | Admitting: Family Medicine

## 2018-02-08 ENCOUNTER — Encounter: Payer: Self-pay | Admitting: Family Medicine

## 2018-03-24 ENCOUNTER — Other Ambulatory Visit: Payer: Self-pay | Admitting: Family Medicine

## 2018-03-24 DIAGNOSIS — F411 Generalized anxiety disorder: Secondary | ICD-10-CM

## 2018-03-29 ENCOUNTER — Encounter: Payer: Self-pay | Admitting: Family Medicine

## 2018-03-31 ENCOUNTER — Other Ambulatory Visit: Payer: Self-pay | Admitting: Family Medicine

## 2018-04-01 ENCOUNTER — Other Ambulatory Visit: Payer: Self-pay

## 2018-04-01 ENCOUNTER — Emergency Department (HOSPITAL_BASED_OUTPATIENT_CLINIC_OR_DEPARTMENT_OTHER)
Admission: EM | Admit: 2018-04-01 | Discharge: 2018-04-01 | Disposition: A | Discharge status: Home / Self Care | Payer: Managed Care, Other (non HMO) | Attending: Emergency Medicine | Admitting: Emergency Medicine

## 2018-04-01 ENCOUNTER — Encounter (HOSPITAL_BASED_OUTPATIENT_CLINIC_OR_DEPARTMENT_OTHER): Payer: Self-pay | Admitting: Emergency Medicine

## 2018-04-01 ENCOUNTER — Emergency Department (HOSPITAL_BASED_OUTPATIENT_CLINIC_OR_DEPARTMENT_OTHER): Payer: Managed Care, Other (non HMO)

## 2018-04-01 DIAGNOSIS — W19XXXA Unspecified fall, initial encounter: Secondary | ICD-10-CM | POA: Diagnosis not present

## 2018-04-01 DIAGNOSIS — Y999 Unspecified external cause status: Secondary | ICD-10-CM | POA: Diagnosis not present

## 2018-04-01 DIAGNOSIS — Y939 Activity, unspecified: Secondary | ICD-10-CM | POA: Diagnosis not present

## 2018-04-01 DIAGNOSIS — S29012A Strain of muscle and tendon of back wall of thorax, initial encounter: Secondary | ICD-10-CM | POA: Diagnosis not present

## 2018-04-01 DIAGNOSIS — Z79899 Other long term (current) drug therapy: Secondary | ICD-10-CM | POA: Diagnosis not present

## 2018-04-01 DIAGNOSIS — Z7984 Long term (current) use of oral hypoglycemic drugs: Secondary | ICD-10-CM | POA: Diagnosis not present

## 2018-04-01 DIAGNOSIS — I10 Essential (primary) hypertension: Secondary | ICD-10-CM | POA: Insufficient documentation

## 2018-04-01 DIAGNOSIS — Y929 Unspecified place or not applicable: Secondary | ICD-10-CM | POA: Diagnosis not present

## 2018-04-01 DIAGNOSIS — E119 Type 2 diabetes mellitus without complications: Secondary | ICD-10-CM | POA: Insufficient documentation

## 2018-04-01 DIAGNOSIS — R079 Chest pain, unspecified: Secondary | ICD-10-CM | POA: Diagnosis present

## 2018-04-01 DIAGNOSIS — S46812A Strain of other muscles, fascia and tendons at shoulder and upper arm level, left arm, initial encounter: Secondary | ICD-10-CM

## 2018-04-01 LAB — CBC
HCT: 37.3 % (ref 36.0–46.0)
HEMOGLOBIN: 11.7 g/dL — AB (ref 12.0–15.0)
MCH: 25.7 pg — AB (ref 26.0–34.0)
MCHC: 31.4 g/dL (ref 30.0–36.0)
MCV: 82 fL (ref 78.0–100.0)
Platelets: 296 10*3/uL (ref 150–400)
RBC: 4.55 MIL/uL (ref 3.87–5.11)
RDW: 14.9 % (ref 11.5–15.5)
WBC: 9.8 10*3/uL (ref 4.0–10.5)

## 2018-04-01 LAB — BASIC METABOLIC PANEL
Anion gap: 12 (ref 5–15)
BUN: 12 mg/dL (ref 6–20)
CHLORIDE: 100 mmol/L (ref 98–111)
CO2: 25 mmol/L (ref 22–32)
Calcium: 8.7 mg/dL — ABNORMAL LOW (ref 8.9–10.3)
Creatinine, Ser: 0.77 mg/dL (ref 0.44–1.00)
GFR calc non Af Amer: >60 mL/min (ref >60)
GLUCOSE: 202 mg/dL — AB (ref 70–99)
Potassium: 3.6 mmol/L (ref 3.5–5.1)
Sodium: 137 mmol/L (ref 135–145)

## 2018-04-01 LAB — PREGNANCY, URINE: Preg Test, Ur: NEGATIVE

## 2018-04-01 LAB — TROPONIN I: Troponin I: <0.03 ng/mL (ref <0.03)

## 2018-04-01 NOTE — ED Triage Notes (Signed)
Reports left shoulder and back pain which radiates into left ribs.  States this began Saturday.  Reports nausea without vomiting and shortness of breath.

## 2018-04-01 NOTE — Discharge Instructions (Signed)
Take 4 over the counter ibuprofen tablets 3 times a day or 2 over-the-counter naproxen tablets twice a day for pain. Also take tylenol 1000mg(2 extra strength) four times a day.    

## 2018-04-01 NOTE — ED Provider Notes (Signed)
[facility] EMERGENCY DEPARTMENT Provider Note   CSN: [id number] Arrival date & time: 04/01/18  1545     History   Chief Complaint Chief Complaint  Patient presents with  . Chest Pain    HPI Taylor Yang is a 42 y.o. female.  42 yo F with a chief complaint of back pain.  This started about 3 or 4 days ago after she fell.  Pain is worse with movement palpation deep breathing.  She denies any other injury.  She is concerned because the pain is now wrapping around into her left lateral chest.  She is concerned that maybe she is having a heart attack.  Denies history of PE or DVT.  Denies hemoptysis denies recent surgery denies prolonged immobilization.  She is unsure if she takes an estrogen for birth control.  She denies history of MI denies history of smoking.  Has a history of hypertension hyperlipidemia and diabetes.  Denies family history of MI.  The history is provided by the patient.  Chest Pain   This is a new problem. The current episode started yesterday. The problem occurs constantly. The problem has been gradually worsening. The pain is associated with movement, breathing and raising an arm. The pain is present in the lateral region. The pain is at a severity of 8/10. The pain is moderate. The quality of the pain is described as brief. The pain radiates to the left shoulder. Duration of episode(s) is 5 days. Associated symptoms include back pain. Pertinent negatives include no dizziness, no fever, no headaches, no nausea, no palpitations, no shortness of breath and no vomiting. She has tried nothing for the symptoms. The treatment provided no relief.  Her past medical history is significant for diabetes, hyperlipidemia and hypertension.  Pertinent negatives for past medical history include no DVT, no MI and no PE.  Pertinent negatives for family medical history include: no early MI.    Past Medical History:  Diagnosis Date  . Allergy   . Anxiety   . Hypertension       Patient Active Problem List   Diagnosis Date Noted  . Controlled type 2 diabetes mellitus without complication, without long-term current use of insulin ([place]) 07/30/2017  . Acute nonseasonal allergic rhinitis due to pollen 07/24/2016  . Prediabetes 07/24/2016  . Morbid obesity ([place]) 07/24/2016  . Essential hypertension 07/24/2016    History reviewed. No pertinent surgical history.   OB History   None      Home Medications    Prior to Admission medications   Medication Sig Start Date End Date Taking? Authorizing Provider  amLODipine (NORVASC) 10 MG tablet TAKE 1 TABLET(10 MG) BY MOUTH DAILY 03/31/18   Delia Chimes A, MD  atorvastatin (LIPITOR) 10 MG tablet Take 1 tablet (10 mg total) by mouth daily. 08/01/17   Forrest Moron, MD  busPIRone (BUSPAR) 5 MG tablet TAKE 1 TABLET(5 MG) BY MOUTH TWICE DAILY 03/25/18   Delia Chimes A, MD  carvedilol (COREG) 12.5 MG tablet Take 1 tablet (12.5 mg total) by mouth 2 (two) times daily with a meal. 07/30/17   Forrest Moron, MD  cetirizine (ZYRTEC) 10 MG tablet Take 1 tablet (10 mg total) by mouth daily. 07/30/17   Forrest Moron, MD  fluticasone (FLONASE) 50 MCG/ACT nasal spray Place 2 sprays into both nostrils daily. 07/30/17   Delia Chimes A, MD  glucose blood test strip Use to check blood glucose daily and as needed. Use as instructed. E11.9 01/27/18  Delia Chimes A, MD  hydrochlorothiazide (HYDRODIURIL) 25 MG tablet TAKE 1 TABLET(25 MG) BY MOUTH DAILY 03/25/18   Delia Chimes A, MD  losartan (COZAAR) 100 MG tablet TAKE 1 TABLET(100 MG) BY MOUTH DAILY 07/30/17   Delia Chimes A, MD  metFORMIN (GLUCOPHAGE) 500 MG tablet Take 1 tablet (500 mg total) by mouth daily with breakfast. 07/30/17   Forrest Moron, MD  PARoxetine (PAXIL) 20 MG tablet Take 1 tablet (20 mg total) by mouth daily. 07/30/17   Forrest Moron, MD    Family History Family History  Problem Relation Age of Onset  . Hyperlipidemia Mother   . Hypertension  Mother   . Hyperlipidemia Maternal Grandmother   . Hypertension Maternal Grandmother   . Hypertension Paternal Grandmother   . Hyperlipidemia Paternal Grandmother     Social History Social History   Tobacco Use  . Smoking status: Never Smoker  . Smokeless tobacco: Never Used  Substance Use Topics  . Alcohol use: Yes    Alcohol/week: 5.0 standard drinks    Types: 5 Glasses of wine per week    Comment: glasses a month  . Drug use: No     Allergies   Sulfa antibiotics   Review of Systems Review of Systems  Constitutional: Negative for chills and fever.  HENT: Negative for congestion and rhinorrhea.   Eyes: Negative for redness and visual disturbance.  Respiratory: Negative for shortness of breath and wheezing.   Cardiovascular: Positive for chest pain. Negative for palpitations.  Gastrointestinal: Negative for nausea and vomiting.  Genitourinary: Negative for dysuria and urgency.  Musculoskeletal: Positive for back pain. Negative for arthralgias and myalgias.  Skin: Negative for pallor and wound.  Neurological: Negative for dizziness and headaches.     Physical Exam Updated Vital Signs BP (!) 141/64 (BP Location: Left Arm)   Pulse 90   Temp 99 F (37.2 C) (Oral)   Resp 20   Ht 5\' 7"  (1.702 m)   Wt (!) 145.2 kg   LMP 03/21/2018   SpO2 96%   BMI 50.12 kg/m   Physical Exam  Constitutional: She is oriented to person, place, and time. She appears well-developed and well-nourished. No distress.  HENT:  Head: Normocephalic and atraumatic.  Eyes: Pupils are equal, round, and reactive to light. EOM are normal.  Neck: Normal range of motion. Neck supple.  Cardiovascular: Normal rate and regular rhythm. Exam reveals no gallop and no friction rub.  No murmur heard. Pulmonary/Chest: Effort normal. She has no wheezes. She has no rales.  Abdominal: Soft. She exhibits no distension and no mass. There is no tenderness. There is no guarding.  Musculoskeletal: She exhibits  tenderness. She exhibits no edema.  Tender to palpation about the trapezius muscle belly worse between the scapula and the upper thoracic spine.  Neurological: She is alert and oriented to person, place, and time.  Skin: Skin is warm and dry. She is not diaphoretic.  Psychiatric: She has a normal mood and affect. Her behavior is normal.  Nursing note and vitals reviewed.    ED Treatments / Results  Labs (all labs ordered are listed, but only abnormal results are displayed) Labs Reviewed  BASIC METABOLIC PANEL - Abnormal; Notable for the following components:      Result Value   Glucose, Bld 202 (*)    Calcium 8.7 (*)    All other components within normal limits  CBC - Abnormal; Notable for the following components:   Hemoglobin 11.7 (*)  MCH 25.7 (*)    All other components within normal limits  TROPONIN I  PREGNANCY, URINE    EKG EKG Interpretation  Date/Time:  Thursday April 01 2018 15:56:06 EDT Ventricular Rate:  96 PR Interval:  150 QRS Duration: 82 QT Interval:  368 QTC Calculation: 464 R Axis:   11 Text Interpretation:  Normal sinus rhythm Anterior infarct , age undetermined Abnormal ECG No old tracing to compare Confirmed by Deno Etienne [phone]) on 04/01/2018 4:40:59 PM   Radiology Dg Chest 2 View  Result Date: 04/01/2018 CLINICAL DATA:  Chest pain since 03/27/2018. EXAM: CHEST - 2 VIEW COMPARISON:  None. FINDINGS: Lung volumes are low with mild subsegmental atelectasis in the left lung base. Lungs otherwise clear. Heart size normal. No pneumothorax or pleural effusion. No acute or focal bony abnormality. IMPRESSION: No acute disease. Electronically Signed   By: Inge Rise M.D.   On: 04/01/2018 16:23    Procedures Procedures (including critical care time)  Medications Ordered in ED Medications - No data to display   Initial Impression / Assessment and Plan / ED Course  I have reviewed the triage vital signs and the nursing notes.  Pertinent labs &  imaging results that were available during my care of the patient were reviewed by me and considered in my medical decision making (see chart for details).     42 yo F with a chief complaint of chest pain.  Patient started having pain after she fell.  Started on her back and now she feels that wraps around to her left lateral chest wall.  She has pain on palpation of the left trapezius.  Think this is the most likely cause of her pain.  Patient does have multiple risk factors, she had labs ordered in triage.  I do not feel that these need to be repeated.  I feel her symptoms are completely atypical of ACS or PE.  Patient's troponin is negative she is not pregnant her renal function is normal no anion gap hemoglobin is slightly low at 11.7.  Chest x-ray reviewed by me without focal infiltrate or pneumothorax.  I discussed the results with the patient.  Will place in a sling to help her rest her trapezius.  PCP follow-up.  6:05 PM:  I have discussed the diagnosis/risks/treatment options with the patient and friend and believe the pt to be eligible for discharge home to follow-up with PCP. We also discussed returning to the ED immediately if new or worsening sx occur. We discussed the sx which are most concerning (e.g., sudden worsening pain, fever, inability to tolerate by mouth) that necessitate immediate return. Medications administered to the patient during their visit and any new prescriptions provided to the patient are listed below.  Medications given during this visit Medications - No data to display    The patient appears reasonably screen and/or stabilized for discharge and I doubt any other medical condition or other [facility] requiring further screening, evaluation, or treatment in the ED at this time prior to discharge.    Final Clinical Impressions(s) / ED Diagnoses   Final diagnoses:  Trapezius strain, left, initial encounter    ED Discharge Orders    None       Deno Etienne,  DO 04/01/18 1805

## 2018-04-12 ENCOUNTER — Other Ambulatory Visit: Payer: Self-pay | Admitting: Family Medicine

## 2018-04-13 NOTE — Telephone Encounter (Signed)
Carvedilol 12.5 mg refill Last Refill:07/30/17 # 180 with 1 refill Last OV: 01/27/18 PCP: Burnett Harry, MD Pharmacy:[facility] [phone]

## 2018-04-28 NOTE — Progress Notes (Signed)
Chief Complaint  Patient presents with  . Hypertension and diabetes    3 month f/u    HPI   Diabetes Mellitus: Patient presents for follow up of diabetes. Symptoms: hyperglycemia. Symptoms have gradually worsened. Patient denies hypoglycemia , increase appetite, nausea, paresthesia of the feet, polydipsia and visual disturbances.  Evaluation to date has been included: hemoglobin A1C.  Home sugars: BGs range between 180 and 200.  Lab Results  Component Value Date   HGBA1C 7.3 (A) 04/29/2018   Morbid obesity Pt reports that she is no longer exercising She got busy with work and is eating more fast food She does not have much time to meal prep but will start meal prepping soon Wt Readings from Last 3 Encounters:  04/29/18 (!) 316 lb 3.2 oz (143.4 kg)  04/01/18 (!) 320 lb (145.2 kg)  01/27/18 (!) 319 lb 12.8 oz (145.1 kg)    Hypertension: Patient here for follow-up of elevated blood pressure. She is not exercising and is not adherent to low salt diet.  Blood pressure is well controlled at home. Cardiac symptoms none.  She went to the ER for chest pain and had negative troponin. ACS was ruled out.  Patient denies chest pain, chest pressure/discomfort, claudication, exertional chest pressure/discomfort, irregular heart beat, lower extremity edema, orthopnea, palpitations and paroxysmal nocturnal dyspnea.  Cardiovascular risk factors: diabetes mellitus, dyslipidemia, hypertension, obesity (BMI >= 30 kg/m2) and sedentary lifestyle. Use of agents associated with hypertension: none. History of target organ damage: none. 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 Allergy   . Anxiety   . Hypertension     Current Outpatient Medications  Medication Sig Dispense Refill  . amLODipine (NORVASC) 10 MG tablet TAKE 1 TABLET(10 MG) BY MOUTH DAILY 90 tablet 1  . atorvastatin (LIPITOR) 10 MG tablet Take 1 tablet (10 mg total) by mouth daily. 90 tablet 3  . busPIRone (BUSPAR) 5 MG tablet TAKE 1 TABLET(5 MG) BY MOUTH TWICE DAILY 60 tablet 1  . carvedilol (COREG) 12.5 MG tablet Take 1 tablet (12.5 mg total) by mouth 2 (two) times daily with a meal. 180 tablet 1  . cetirizine (ZYRTEC) 10 MG tablet Take 1 tablet (10 mg total) by mouth daily. 30 tablet 11  . fluticasone (FLONASE) 50 MCG/ACT nasal spray Place 2 sprays into both nostrils daily. 16 g 6  . glucose blood test strip Use to check blood glucose daily and as needed. Use as instructed. E11.9 100 each 12  . hydrochlorothiazide (HYDRODIURIL) 25 MG tablet TAKE 1 TABLET(25 MG) BY MOUTH DAILY 90 tablet 0  . losartan (COZAAR) 100 MG tablet TAKE 1 TABLET(100 MG) BY MOUTH DAILY 90 tablet 1  . metFORMIN (GLUCOPHAGE) 500 MG tablet Take 1 tablet (500 mg total) by mouth 2 (two) times daily with a meal. 180 tablet 1  .  PARoxetine (PAXIL) 20 MG tablet Take 1 tablet (20 mg total) by mouth daily. 90 tablet 3   No current facility-administered medications for this visit.     Allergies:  Allergies  Allergen Reactions  . Sulfa Antibiotics [place]    No past surgical history on file.  Social History   Socioeconomic History  . Marital status: Single    Spouse name: Not on file  . Number of children: Not on file  . Years of education: Not on file  . Highest education level: Not on file  Occupational History  . Not on file  Social Needs  . Financial resource strain: Not on file  . Food  insecurity:    Worry: Not on file    Inability: Not on file  . Transportation needs:    Medical: Not on file    Non-medical: Not on file  Tobacco Use  . Smoking status: Never Smoker  . Smokeless tobacco: Never Used  Substance and Sexual Activity  . Alcohol use: Yes    Alcohol/week: 5.0 standard drinks    Types: 5 Glasses of wine per week    Comment: glasses a month  . Drug use: No  . Sexual activity: Not on file  Lifestyle  . Physical activity:    Days per week: Not on file    Minutes per session: Not on file  . Stress: Not on file  Relationships  . Social connections:    Talks on phone: Not on file    Gets together: Not on file    Attends religious service: Not on file    Active member of club or organization: Not on file    Attends meetings of clubs or organizations: Not on file    Relationship status: Not on file  Other Topics Concern  . Not on file  Social History Narrative  . Not on file    Family History  Problem Relation Age of Onset  . Hyperlipidemia Mother   . Hypertension Mother   . Hyperlipidemia Maternal Grandmother   . Hypertension Maternal Grandmother   . Hypertension Paternal Grandmother   . Hyperlipidemia Paternal Grandmother      ROS Review of Systems See HPI Constitution: No fevers or chills No malaise No diaphoresis Skin: No [place] or itching Eyes: no blurry vision, no double vision GU: no dysuria or hematuria Neuro: no dizziness or headaches  all others reviewed and negative   Objective: Vitals:   04/29/18 1533 04/29/18 1636  BP: (!) 142/94 140/83  Pulse: (!) 102   Resp: 17   Temp: 99.5 F (37.5 C)   TempSrc: Oral   SpO2: 95%   Weight: (!) 316 lb 3.2 oz (143.4 kg)   Height: 5\' 7"  (1.702 m)     Physical Exam  Constitutional: She is oriented to person, place, and time. She appears well-developed and well-nourished.  HENT:  Head: Normocephalic and atraumatic.  Eyes: Conjunctivae and EOM are normal.  Cardiovascular: Normal  rate, regular rhythm and normal heart sounds.  No murmur heard. Pulmonary/Chest: Effort normal and breath sounds normal.  Musculoskeletal: Normal range of motion. She exhibits no edema.  Neurological: She is alert and oriented to person, place, and time.  Skin: Skin is warm. Capillary refill takes less than 2 seconds. No erythema.  Psychiatric: She has a normal mood and affect. Her behavior is normal. Judgment and thought content normal.    Assessment and Plan Dezyrae was seen today for hypertension and diabetes.  Diagnoses and all orders  for this visit:  Essential hypertension- Patient's blood pressure goal of 139/89 or less. Condition is improving. Continue current medications and treatment plan. I recommend that you exercise for 30-45 minutes 5 days a week. I also recommend a balanced diet with fruits and vegetables every day, lean meats, and little fried foods. The DASH diet (you can find this online) is a good example of this.   Flu vaccine need -     Flu Vaccine QUAD 36+ mos IM  Morbid obesity ([place])- increase metformin to BID to help with glucose and weight loss  GAD (generalized anxiety disorder) -     busPIRone (BUSPAR) 5 MG tablet; TAKE 1 TABLET(5 MG) BY MOUTH TWICE DAILY  Type 2 diabetes mellitus without complication, without long-term current use of insulin (H[facility]) -     HM Diabetes Foot Exam -     POCT glycosylated hemoglobin (Hb A1C) -  Diabetes improving hemoglobin a1c is near goal Continue exercise On metformin On arb On asa 81mg  Reviewed diabetic foot care Emphasized importance of eye and dental exam    Other orders -     carvedilol (COREG) 12.5 MG tablet; Take 1 tablet (12.5 mg total) by mouth 2 (two) times daily with a meal. -     losartan (COZAAR) 100 MG tablet; TAKE 1 TABLET(100 MG) BY MOUTH DAILY -     metFORMIN (GLUCOPHAGE) 500 MG tablet; Take 1 tablet (500 mg total) by mouth 2 (two) times daily with a meal. -     atorvastatin (LIPITOR) 10 MG tablet;  Take 1 tablet (10 mg total) by mouth daily. -     amLODipine (NORVASC) 10 MG tablet; TAKE 1 TABLET(10 MG) BY MOUTH DAILY -     PARoxetine (PAXIL) 20 MG tablet; Take 1 tablet (20 mg total) by mouth daily. -     hydrochlorothiazide (HYDRODIURIL) 25 MG tablet; TAKE 1 TABLET(25 MG) BY MOUTH DAILY   A total of 40 minutes were spent face-to-face with the patient during this encounter and over half of that time was spent on counseling and coordination of care.   [place]

## 2018-04-29 ENCOUNTER — Ambulatory Visit: Payer: Managed Care, Other (non HMO) | Admitting: Family Medicine

## 2018-04-29 ENCOUNTER — Other Ambulatory Visit: Payer: Self-pay

## 2018-04-29 ENCOUNTER — Encounter: Payer: Self-pay | Admitting: Family Medicine

## 2018-04-29 VITALS — BP 140/83 | HR 102 | Temp 99.5°F | Resp 17 | Ht 67.0 in | Wt 316.2 lb

## 2018-04-29 DIAGNOSIS — F411 Generalized anxiety disorder: Secondary | ICD-10-CM | POA: Diagnosis not present

## 2018-04-29 DIAGNOSIS — E119 Type 2 diabetes mellitus without complications: Secondary | ICD-10-CM

## 2018-04-29 DIAGNOSIS — I10 Essential (primary) hypertension: Secondary | ICD-10-CM | POA: Diagnosis not present

## 2018-04-29 DIAGNOSIS — Z23 Encounter for immunization: Secondary | ICD-10-CM | POA: Diagnosis not present

## 2018-04-29 LAB — POCT GLYCOSYLATED HEMOGLOBIN (HGB A1C): HEMOGLOBIN A1C: 7.3 % — AB (ref 4.0–5.6)

## 2018-04-29 MED ORDER — LOSARTAN POTASSIUM 100 MG PO TABS: ORAL_TABLET | ORAL | 1 refills | Status: DC

## 2018-04-29 MED ORDER — METFORMIN HCL 500 MG PO TABS: 500.0000 mg | ORAL_TABLET | Freq: Two times a day (BID) | ORAL | 1 refills | Status: DC

## 2018-04-29 MED ORDER — PAROXETINE HCL 20 MG PO TABS: 20.0000 mg | ORAL_TABLET | Freq: Every day | ORAL | 3 refills | Status: DC

## 2018-04-29 MED ORDER — BUSPIRONE HCL 5 MG PO TABS: ORAL_TABLET | ORAL | 1 refills | Status: DC

## 2018-04-29 MED ORDER — ATORVASTATIN CALCIUM 10 MG PO TABS: 10.0000 mg | ORAL_TABLET | Freq: Every day | ORAL | 3 refills | Status: DC

## 2018-04-29 MED ORDER — CARVEDILOL 12.5 MG PO TABS: 12.5000 mg | ORAL_TABLET | Freq: Two times a day (BID) | ORAL | 1 refills | Status: DC

## 2018-04-29 MED ORDER — AMLODIPINE BESYLATE 10 MG PO TABS: ORAL_TABLET | ORAL | 1 refills | Status: DC

## 2018-04-29 MED ORDER — HYDROCHLOROTHIAZIDE 25 MG PO TABS: ORAL_TABLET | ORAL | 0 refills | Status: DC

## 2018-04-29 NOTE — Patient Instructions (Signed)
° ° ° °  If you have lab work done today you will be contacted with your lab results within the next 2 weeks.  If you have not heard from us then please contact us. The fastest way to get your results is to register for My Chart. ° ° °IF you received an x-ray today, you will receive an invoice from [facility]. Please contact [facility] at [phone] with questions or concerns regarding your invoice.  ° °IF you received labwork today, you will receive an invoice from [facility]. Please contact [facility] at [phone] with questions or concerns regarding your invoice.  ° °Our billing staff will not be able to assist you with questions regarding bills from these companies. ° °You will be contacted with the lab results as soon as they are available. The fastest way to get your results is to activate your My Chart account. Instructions are located on the last page of this paperwork. If you have not heard from us regarding the results in 2 weeks, please contact this office. °  ° ° ° °

## 2018-09-30 ENCOUNTER — Telehealth: Payer: Self-pay | Admitting: Family Medicine

## 2018-09-30 NOTE — Telephone Encounter (Signed)
Called and spoke with pt regarding their appt scheduled with Dr. Nolon Rod on 4/8. I was able to get the pt rescheduled to 4/9 with Dr. Nolon Rod. I advised of time and late policy. Pt acknowledged.

## 2018-10-23 ENCOUNTER — Other Ambulatory Visit: Payer: Self-pay | Admitting: Family Medicine

## 2018-10-23 DIAGNOSIS — F411 Generalized anxiety disorder: Secondary | ICD-10-CM

## 2018-10-25 NOTE — Telephone Encounter (Signed)
Requested Prescriptions  Pending Prescriptions Disp Refills  . busPIRone (BUSPAR) 5 MG tablet [Pharmacy Med Name: BUSPIRONE 5MG  TABLETS] 60 tablet 1    Sig: TAKE 1 TABLET(5 MG) BY MOUTH TWICE DAILY     Psychiatry: Anxiolytics/Hypnotics - Non-controlled Passed - 10/23/2018 10:08 AM      Passed - Valid encounter within last 6 months    Recent Outpatient Visits          5 months ago Essential hypertension   [facility] at [place], MD   9 months ago GAD (generalized anxiety disorder)   Primary Care at [facility], Arlie Solomons, MD   1 year ago Bacterial conjunctivitis of both eyes   Primary Care at [facility], Arlie Solomons, MD   1 year ago Annual physical exam   Primary Care at [facility], Arlie Solomons, MD   1 year ago Morbid obesity [facility])   Primary Care at [place], MD      Future Appointments            In 3 weeks Forrest Moron, MD Primary Care at [place], [facility]

## 2018-11-12 ENCOUNTER — Other Ambulatory Visit: Payer: Self-pay | Admitting: *Deleted

## 2018-11-12 DIAGNOSIS — Z0000 Encounter for general adult medical examination without abnormal findings: Secondary | ICD-10-CM

## 2018-11-12 DIAGNOSIS — I10 Essential (primary) hypertension: Secondary | ICD-10-CM

## 2018-11-12 DIAGNOSIS — E119 Type 2 diabetes mellitus without complications: Secondary | ICD-10-CM

## 2018-11-12 DIAGNOSIS — E039 Hypothyroidism, unspecified: Secondary | ICD-10-CM

## 2018-11-17 ENCOUNTER — Encounter: Payer: Managed Care, Other (non HMO) | Admitting: Family Medicine

## 2018-11-18 ENCOUNTER — Other Ambulatory Visit: Payer: Self-pay

## 2018-11-18 ENCOUNTER — Telehealth (INDEPENDENT_AMBULATORY_CARE_PROVIDER_SITE_OTHER): Payer: Managed Care, Other (non HMO) | Admitting: Family Medicine

## 2018-11-18 ENCOUNTER — Ambulatory Visit (INDEPENDENT_AMBULATORY_CARE_PROVIDER_SITE_OTHER): Payer: Managed Care, Other (non HMO) | Admitting: Family Medicine

## 2018-11-18 DIAGNOSIS — E1165 Type 2 diabetes mellitus with hyperglycemia: Secondary | ICD-10-CM

## 2018-11-18 DIAGNOSIS — Z0000 Encounter for general adult medical examination without abnormal findings: Secondary | ICD-10-CM

## 2018-11-18 DIAGNOSIS — E119 Type 2 diabetes mellitus without complications: Secondary | ICD-10-CM

## 2018-11-18 DIAGNOSIS — F411 Generalized anxiety disorder: Secondary | ICD-10-CM | POA: Diagnosis not present

## 2018-11-18 DIAGNOSIS — I10 Essential (primary) hypertension: Secondary | ICD-10-CM

## 2018-11-18 DIAGNOSIS — E039 Hypothyroidism, unspecified: Secondary | ICD-10-CM

## 2018-11-18 LAB — POCT GLYCOSYLATED HEMOGLOBIN (HGB A1C): Hemoglobin A1C: 7.9 % — AB (ref 4.0–5.6)

## 2018-11-18 MED ORDER — PAROXETINE HCL 20 MG PO TABS: 20.0000 mg | ORAL_TABLET | Freq: Every day | ORAL | 3 refills | Status: AC

## 2018-11-18 MED ORDER — CETIRIZINE HCL 10 MG PO TABS: 10.0000 mg | ORAL_TABLET | Freq: Every day | ORAL | 11 refills | Status: AC

## 2018-11-18 MED ORDER — CARVEDILOL 12.5 MG PO TABS: 12.5000 mg | ORAL_TABLET | Freq: Two times a day (BID) | ORAL | 1 refills | Status: DC

## 2018-11-18 MED ORDER — LOSARTAN POTASSIUM 100 MG PO TABS: ORAL_TABLET | ORAL | 1 refills | Status: AC

## 2018-11-18 MED ORDER — BUSPIRONE HCL 10 MG PO TABS: 10.0000 mg | ORAL_TABLET | Freq: Two times a day (BID) | ORAL | 11 refills | Status: DC

## 2018-11-18 MED ORDER — METFORMIN HCL 500 MG PO TABS: 500.0000 mg | ORAL_TABLET | Freq: Two times a day (BID) | ORAL | 1 refills | Status: AC

## 2018-11-18 MED ORDER — ATORVASTATIN CALCIUM 10 MG PO TABS: 10.0000 mg | ORAL_TABLET | Freq: Every day | ORAL | 3 refills | Status: AC

## 2018-11-18 MED ORDER — HYDROCHLOROTHIAZIDE 25 MG PO TABS: ORAL_TABLET | ORAL | 1 refills | Status: DC

## 2018-11-18 MED ORDER — FLUTICASONE PROPIONATE 50 MCG/ACT NA SUSP: 2.0000 | Freq: Every day | NASAL | 6 refills | Status: AC

## 2018-11-18 MED ORDER — BUSPIRONE HCL 10 MG PO TABS: 10.0000 mg | ORAL_TABLET | Freq: Two times a day (BID) | ORAL | 11 refills | Status: AC

## 2018-11-18 MED ORDER — AMLODIPINE BESYLATE 10 MG PO TABS: ORAL_TABLET | ORAL | 1 refills | Status: AC

## 2018-11-18 NOTE — Progress Notes (Signed)
Telemedicine Encounter- SOAP NOTE Established Patient  This telephone encounter was conducted with the patient's (or proxy's) verbal consent via audio telecommunications: yes/no: Yes Patient was instructed to have this encounter in a suitably private space; and to only have persons present to whom they give permission to participate. In addition, patient identity was confirmed by use of name plus two identifiers (DOB and address).  I discussed the limitations, risks, security and privacy concerns of performing an evaluation and management service by telephone and the availability of in person appointments. I also discussed with the patient that there may be a patient responsible charge related to this service. The patient expressed understanding and agreed to proceed.  I spent a total of TIME; 0 MIN TO 60 MIN: 25 minutes talking with the patient or their proxy.  CC: diabetes  Subjective   Taylor Yang is a 43 y.o. established patient. Telephone visit today for  HPI   Health Maintenance Exam Eye exam: will be getting a new eye docotor Dentist: up to date Exercise: 2 days a week for 30 minutes Alcohol: none She sees Gynecology for pap    Diabetes Mellitus: Patient presents for follow up of diabetes. Symptoms: increase appetite. Symptoms have stabilized. Patient denies hyperglycemia, nausea, polyuria, visual disturbances, vomitting and weight loss.  Evaluation to date has been included: hemoglobin A1C.  Home sugars: symptomatic hypoglycemia does not occur. She states that she has just been having less time for exercise.  Lab Results  Component Value Date   HGBA1C 7.8 (H) 11/18/2018   Morbid Obesity She reports that she is weight 320 pounds She states that she is trying to exercise She has fallen off from her plan She is stress eating and "keeps crazy hours at work" [employer] from Last 3 Encounters:  04/29/18 (!) 316 lb 3.2 oz (143.4 kg)  04/01/18 (!) 320 lb (145.2 kg)   01/27/18 (!) 319 lb 12.8 oz (145.1 kg)   Emotional Stress She is stress eating and reports that she has worsening anxiety due to the new job. She denies depressed mood. She has been putting pressure on herself.   Depression screen [facility] 2/9 11/18/2018 04/29/2018 01/27/2018 09/02/2017 07/30/2017  Decreased Interest 1 0 0 0 0  Down, Depressed, Hopeless 0 0 0 0 0  PHQ - 2 Score 1 0 0 0 0     Review of Systems  Constitutional: Negative for activity change, appetite change, chills and fever.  HENT: Negative for congestion, nosebleeds, trouble swallowing and voice change.   Respiratory: Negative for cough, shortness of breath and wheezing.   Gastrointestinal: Negative for diarrhea, nausea and vomiting.  Genitourinary: Negative for difficulty urinating, dysuria, flank pain and hematuria.  Musculoskeletal: Negative for back pain, joint swelling and neck pain.  Neurological: Negative for dizziness, speech difficulty, light-headedness and numbness.  See HPI. All other review of systems negative.   Patient Active Problem List   Diagnosis Date Noted  . Controlled type 2 diabetes mellitus without complication, without long-term current use of insulin ([place]) 07/30/2017  . Acute nonseasonal allergic rhinitis due to pollen 07/24/2016  . Prediabetes 07/24/2016  . Morbid obesity ([place]) 07/24/2016  . Essential hypertension 07/24/2016    Past Medical History:  Diagnosis Date  . Allergy   . Anxiety   . Hypertension     Current Outpatient Medications  Medication Sig Dispense Refill  . amLODipine (NORVASC) 10 MG tablet TAKE 1 TABLET(10 MG) BY MOUTH DAILY 90 tablet 1  . atorvastatin (LIPITOR)  10 MG tablet Take 1 tablet (10 mg total) by mouth daily. 90 tablet 3  . busPIRone (BUSPAR) 10 MG tablet Take 1 tablet (10 mg total) by mouth 2 (two) times daily. 60 tablet 11  . carvedilol (COREG) 12.5 MG tablet Take 1 tablet (12.5 mg total) by mouth 2 (two) times daily with a meal. 180 tablet 1  . cetirizine  (ZYRTEC) 10 MG tablet Take 1 tablet (10 mg total) by mouth daily. 30 tablet 11  . fluticasone (FLONASE) 50 MCG/ACT nasal spray Place 2 sprays into both nostrils daily. 16 g 6  . glucose blood test strip Use to check blood glucose daily and as needed. Use as instructed. E11.9 100 each 12  . hydrochlorothiazide (HYDRODIURIL) 25 MG tablet TAKE 1 TABLET(25 MG) BY MOUTH DAILY 90 tablet 1  . metFORMIN (GLUCOPHAGE) 500 MG tablet Take 1 tablet (500 mg total) by mouth 2 (two) times daily with a meal. 180 tablet 1  . PARoxetine (PAXIL) 20 MG tablet Take 1 tablet (20 mg total) by mouth daily. 90 tablet 3  . losartan (COZAAR) 100 MG tablet TAKE 1 TABLET(100 MG) BY MOUTH DAILY 90 tablet 1   No current facility-administered medications for this visit.     Allergies  Allergen Reactions  . Sulfa Antibiotics [place]    Social History   Socioeconomic History  . Marital status: Single    Spouse name: Not on file  . Number of children: Not on file  . Years of education: Not on file  . Highest education level: Not on file  Occupational History  . Not on file  Social Needs  . Financial resource strain: Not on file  . Food insecurity:    Worry: Not on file    Inability: Not on file  . Transportation needs:    Medical: Not on file    Non-medical: Not on file  Tobacco Use  . Smoking status: Never Smoker  . Smokeless tobacco: Never Used  Substance and Sexual Activity  . Alcohol use: Yes    Alcohol/week: 5.0 standard drinks    Types: 5 Glasses of wine per week    Comment: glasses a month  . Drug use: No  . Sexual activity: Not on file  Lifestyle  . Physical activity:    Days per week: Not on file    Minutes per session: Not on file  . Stress: Not on file  Relationships  . Social connections:    Talks on phone: Not on file    Gets together: Not on file    Attends religious service: Not on file    Active member of club or organization: Not on file    Attends meetings of clubs or organizations:  Not on file    Relationship status: Not on file  . Intimate partner violence:    Fear of current or ex partner: Not on file    Emotionally abused: Not on file    Physically abused: Not on file    Forced sexual activity: Not on file  Other Topics Concern  . Not on file  Social History Narrative  . Not on file    ROS Review of Systems  Constitutional: Negative for activity change, appetite change, chills and fever.  HENT: Negative for congestion, nosebleeds, trouble swallowing and voice change.   Respiratory: Negative for cough, shortness of breath and wheezing.   Gastrointestinal: Negative for diarrhea, nausea and vomiting.  Genitourinary: Negative for difficulty urinating, dysuria, flank pain and hematuria.  Musculoskeletal: Negative for back pain, joint swelling and neck pain.  Neurological: Negative for dizziness, speech difficulty, light-headedness and numbness.  See HPI. All other review of systems negative.   Objective   Vitals as reported by the patient: There were no vitals filed for this visit.  Diagnoses and all orders for this visit:  Annual physical exam- Women's Health Maintenance Plan Advised monthly breast exam and annual mammogram Advised dental exam every six months Discussed stress management Discussed pap smear screening guidelines   Type 2 diabetes mellitus with hyperglycemia, without long-term current use of insulin ([place])-  Discussed metformin She has seen deterioration of her diabetes  -     Ambulatory referral to Ophthalmology  GAD (generalized anxiety disorder)- discussed buspar for anxiety and ways to help manage anxiety and stress -     Discontinue: busPIRone (BUSPAR) 10 MG tablet; Take 1 tablet (10 mg total) by mouth 2 (two) times daily. -     busPIRone (BUSPAR) 10 MG tablet; Take 1 tablet (10 mg total) by mouth 2 (two) times daily.  Other orders -     carvedilol (COREG) 12.5 MG tablet; Take 1 tablet (12.5 mg total) by mouth 2 (two) times daily  with a meal. -     losartan (COZAAR) 100 MG tablet; TAKE 1 TABLET(100 MG) BY MOUTH DAILY -     cetirizine (ZYRTEC) 10 MG tablet; Take 1 tablet (10 mg total) by mouth daily. -     metFORMIN (GLUCOPHAGE) 500 MG tablet; Take 1 tablet (500 mg total) by mouth 2 (two) times daily with a meal. -     amLODipine (NORVASC) 10 MG tablet; TAKE 1 TABLET(10 MG) BY MOUTH DAILY -     atorvastatin (LIPITOR) 10 MG tablet; Take 1 tablet (10 mg total) by mouth daily. -     fluticasone (FLONASE) 50 MCG/ACT nasal spray; Place 2 sprays into both nostrils daily. -     PARoxetine (PAXIL) 20 MG tablet; Take 1 tablet (20 mg total) by mouth daily. -     hydrochlorothiazide (HYDRODIURIL) 25 MG tablet; TAKE 1 TABLET(25 MG) BY MOUTH DAILY     I discussed the assessment and treatment plan with the patient. The patient was provided an opportunity to ask questions and all were answered. The patient agreed with the plan and demonstrated an understanding of the instructions.   The patient was advised to call back or seek an in-person evaluation if the symptoms worsen or if the condition fails to improve as anticipated.  I provided 25 minutes of non-face-to-face time during this encounter.  Forrest Moron, MD  Primary Care at [facility]

## 2018-11-18 NOTE — Patient Instructions (Signed)
° ° ° °  If you have lab work done today you will be contacted with your lab results within the next 2 weeks.  If you have not heard from us then please contact us. The fastest way to get your results is to register for My Chart. ° ° °IF you received an x-ray today, you will receive an invoice from [facility]. Please contact [facility] at [phone] with questions or concerns regarding your invoice.  ° °IF you received labwork today, you will receive an invoice from [facility]. Please contact [facility] at [phone] with questions or concerns regarding your invoice.  ° °Our billing staff will not be able to assist you with questions regarding bills from these companies. ° °You will be contacted with the lab results as soon as they are available. The fastest way to get your results is to activate your My Chart account. Instructions are located on the last page of this paperwork. If you have not heard from us regarding the results in 2 weeks, please contact this office. °  ° ° ° °

## 2018-11-18 NOTE — Progress Notes (Signed)
CC: Annual CPE.  No concerns per pt.  No travel outside the [place] or [place] in the past 3 weeks.

## 2018-11-18 NOTE — Addendum Note (Signed)
Addended by: Ileana Roup on: 11/18/2018 10:45 AM   Modules accepted: Orders

## 2018-11-19 LAB — HEMOGLOBIN A1C
Est. average glucose Bld gHb Est-mCnc: 177 mg/dL
Hgb A1c MFr Bld: 7.8 % — ABNORMAL HIGH (ref 4.8–5.6)

## 2018-11-19 LAB — COMPREHENSIVE METABOLIC PANEL
ALT: 15 IU/L (ref 0–32)
AST: 21 IU/L (ref 0–40)
Albumin/Globulin Ratio: 1.3 (ref 1.2–2.2)
Albumin: 4.3 g/dL (ref 3.8–4.8)
Alkaline Phosphatase: 107 IU/L (ref 39–117)
BUN/Creatinine Ratio: 18 (ref 9–23)
BUN: 13 mg/dL (ref 6–24)
Bilirubin Total: 0.5 mg/dL (ref 0.0–1.2)
CO2: 16 mmol/L — ABNORMAL LOW (ref 20–29)
Calcium: 9.2 mg/dL (ref 8.7–10.2)
Chloride: 99 mmol/L (ref 96–106)
Creatinine, Ser: 0.73 mg/dL (ref 0.57–1.00)
GFR calc Af Amer: 118 mL/min/{1.73_m2} (ref >59)
GFR calc non Af Amer: 103 mL/min/{1.73_m2} (ref >59)
Globulin, Total: 3.2 g/dL (ref 1.5–4.5)
Glucose: 190 mg/dL — ABNORMAL HIGH (ref 65–99)
Potassium: 4.6 mmol/L (ref 3.5–5.2)
Sodium: 140 mmol/L (ref 134–144)
Total Protein: 7.5 g/dL (ref 6.0–8.5)

## 2018-11-19 LAB — CBC
Hematocrit: 35.5 % (ref 34.0–46.6)
Hemoglobin: 10.5 g/dL — ABNORMAL LOW (ref 11.1–15.9)
MCH: 22.3 pg — ABNORMAL LOW (ref 26.6–33.0)
MCHC: 29.6 g/dL — ABNORMAL LOW (ref 31.5–35.7)
MCV: 75 fL — ABNORMAL LOW (ref 79–97)
Platelets: 376 10*3/uL (ref 150–450)
RBC: 4.71 x10E6/uL (ref 3.77–5.28)
RDW: 16.4 % — ABNORMAL HIGH (ref 11.7–15.4)
WBC: 8.3 10*3/uL (ref 3.4–10.8)

## 2018-11-19 LAB — LIPID PANEL
Chol/HDL Ratio: 4.3 ratio (ref 0.0–4.4)
Cholesterol, Total: 155 mg/dL (ref 100–199)
HDL: 36 mg/dL — ABNORMAL LOW (ref >39)
LDL Calculated: 45 mg/dL (ref 0–99)
Triglycerides: 369 mg/dL — ABNORMAL HIGH (ref 0–149)
VLDL Cholesterol Cal: 74 mg/dL — ABNORMAL HIGH (ref 5–40)

## 2018-11-19 LAB — TSH: TSH: 1.6 u[IU]/mL (ref 0.450–4.500)

## 2018-12-31 ENCOUNTER — Other Ambulatory Visit: Payer: Self-pay | Admitting: Obstetrics and Gynecology

## 2018-12-31 DIAGNOSIS — R928 Other abnormal and inconclusive findings on diagnostic imaging of breast: Secondary | ICD-10-CM

## 2019-01-06 ENCOUNTER — Other Ambulatory Visit: Payer: Self-pay

## 2019-01-06 ENCOUNTER — Ambulatory Visit
Admission: RE | Admit: 2019-01-06 | Discharge: 2019-01-06 | Disposition: A | Discharge status: Home / Self Care | Payer: Managed Care, Other (non HMO) | Source: Ambulatory Visit | Attending: Obstetrics and Gynecology | Admitting: Obstetrics and Gynecology

## 2019-01-06 DIAGNOSIS — R928 Other abnormal and inconclusive findings on diagnostic imaging of breast: Secondary | ICD-10-CM

## 2019-03-16 LAB — HM DIABETES EYE EXAM

## 2019-04-26 ENCOUNTER — Other Ambulatory Visit: Payer: Self-pay

## 2019-04-26 DIAGNOSIS — Z20822 Contact with and (suspected) exposure to covid-19: Secondary | ICD-10-CM

## 2019-04-28 LAB — NOVEL CORONAVIRUS, NAA: SARS-CoV-2, NAA: NOT DETECTED

## 2019-07-01 ENCOUNTER — Other Ambulatory Visit: Payer: Self-pay

## 2019-07-01 DIAGNOSIS — Z20822 Contact with and (suspected) exposure to covid-19: Secondary | ICD-10-CM

## 2019-07-04 LAB — NOVEL CORONAVIRUS, NAA: SARS-CoV-2, NAA: NOT DETECTED

## 2019-09-07 IMAGING — MG DIGITAL DIAGNOSTIC UNILATERAL LEFT MAMMOGRAM WITH TOMO AND CAD
4 series · 4 of 12 positions shown · non-contrast
Comparison: Previous exam(s).

CLINICAL DATA: Screening recall for a possible left breast mass.

EXAM:
DIGITAL DIAGNOSTIC LEFT MAMMOGRAM WITH CAD AND TOMO
ULTRASOUND LEFT BREAST

[L CC synth-2D]
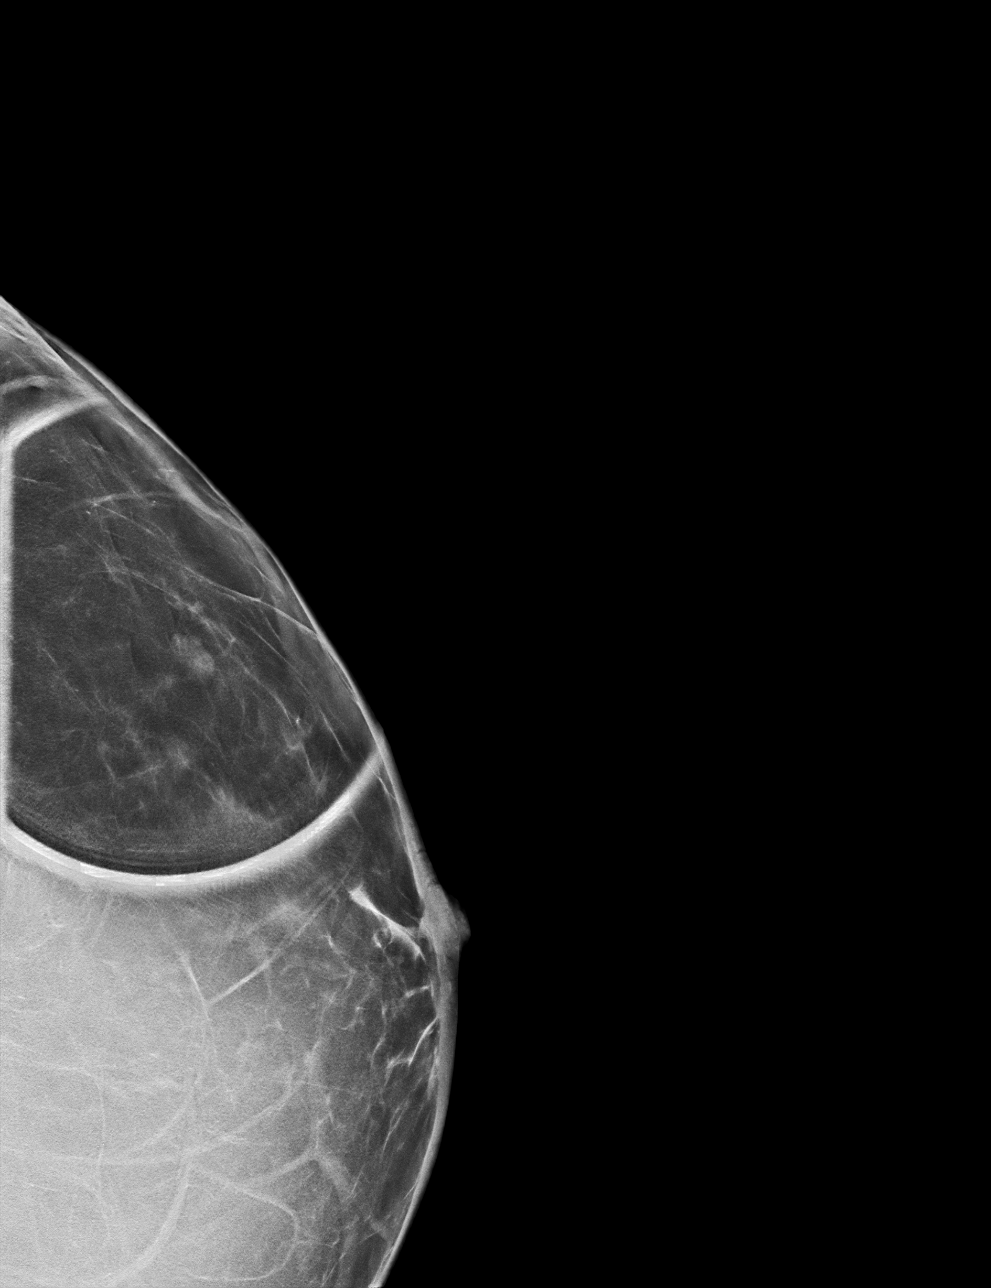

[L MLO synth-2D]
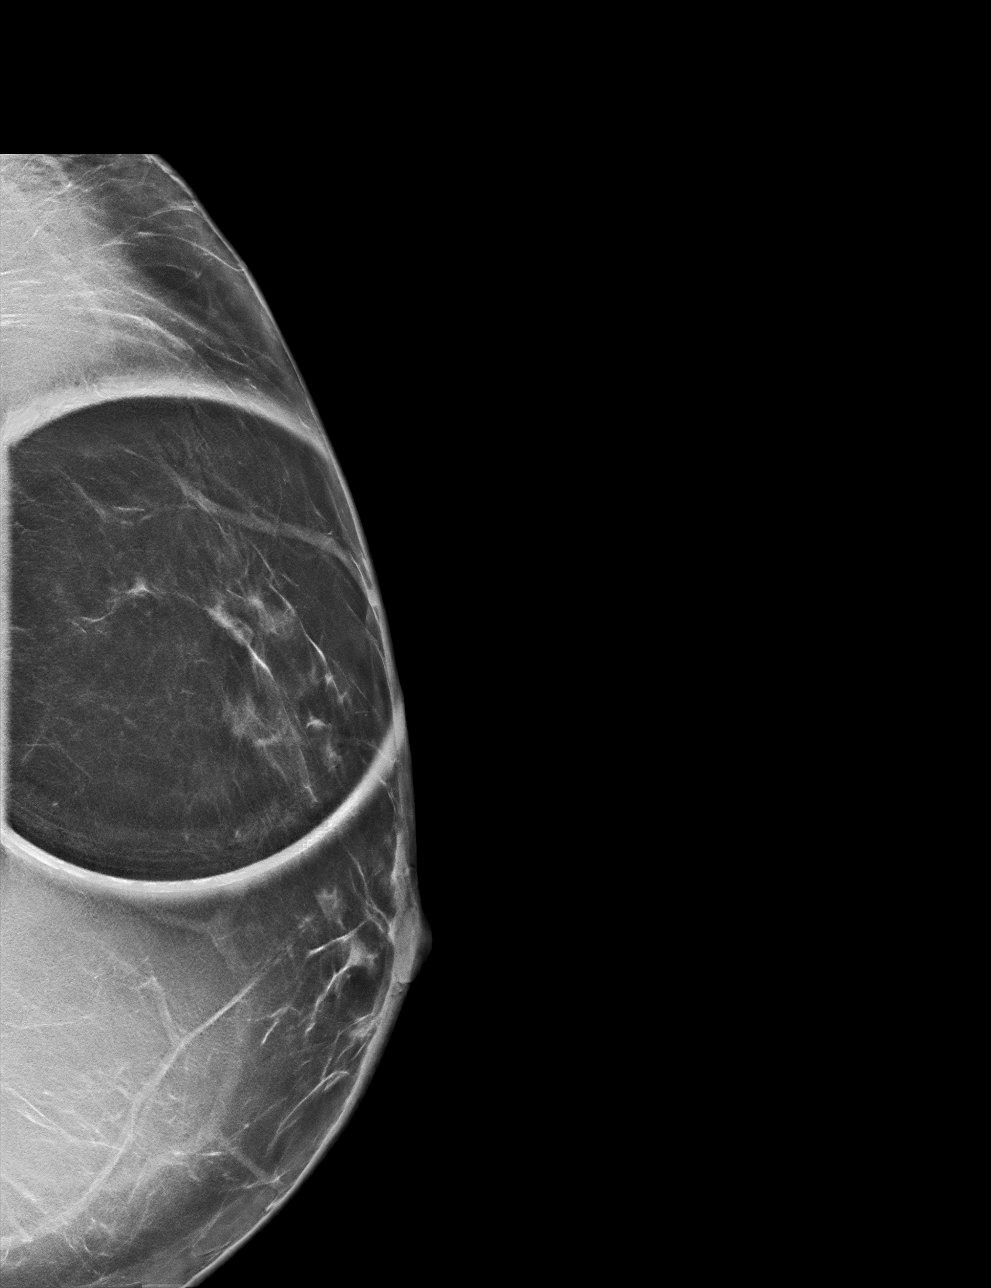

[L CC tomo · tomo slice 33/64.0]
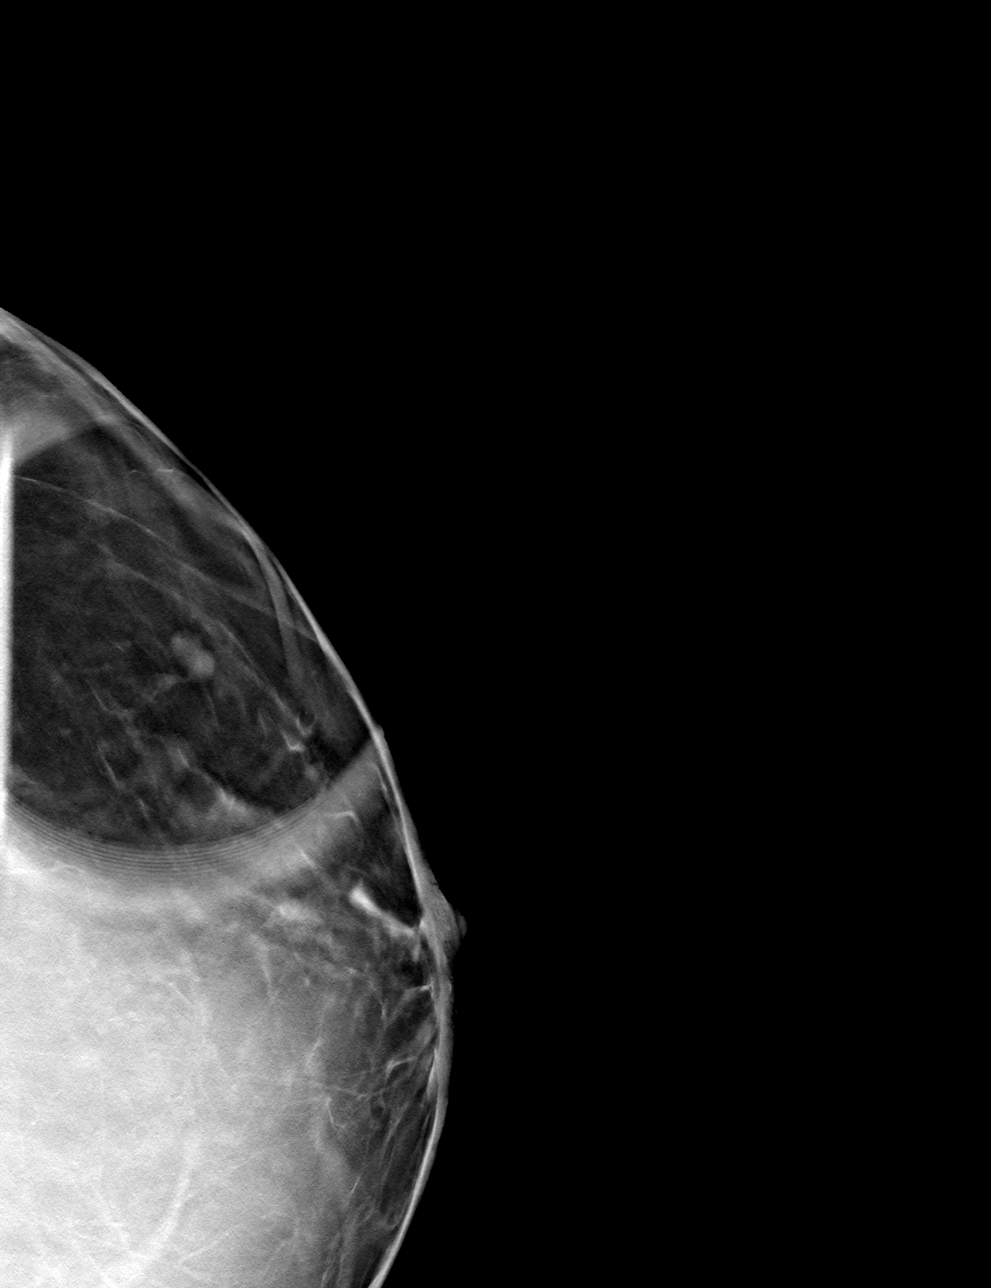

[L MLO tomo · tomo slice 37/73.0]
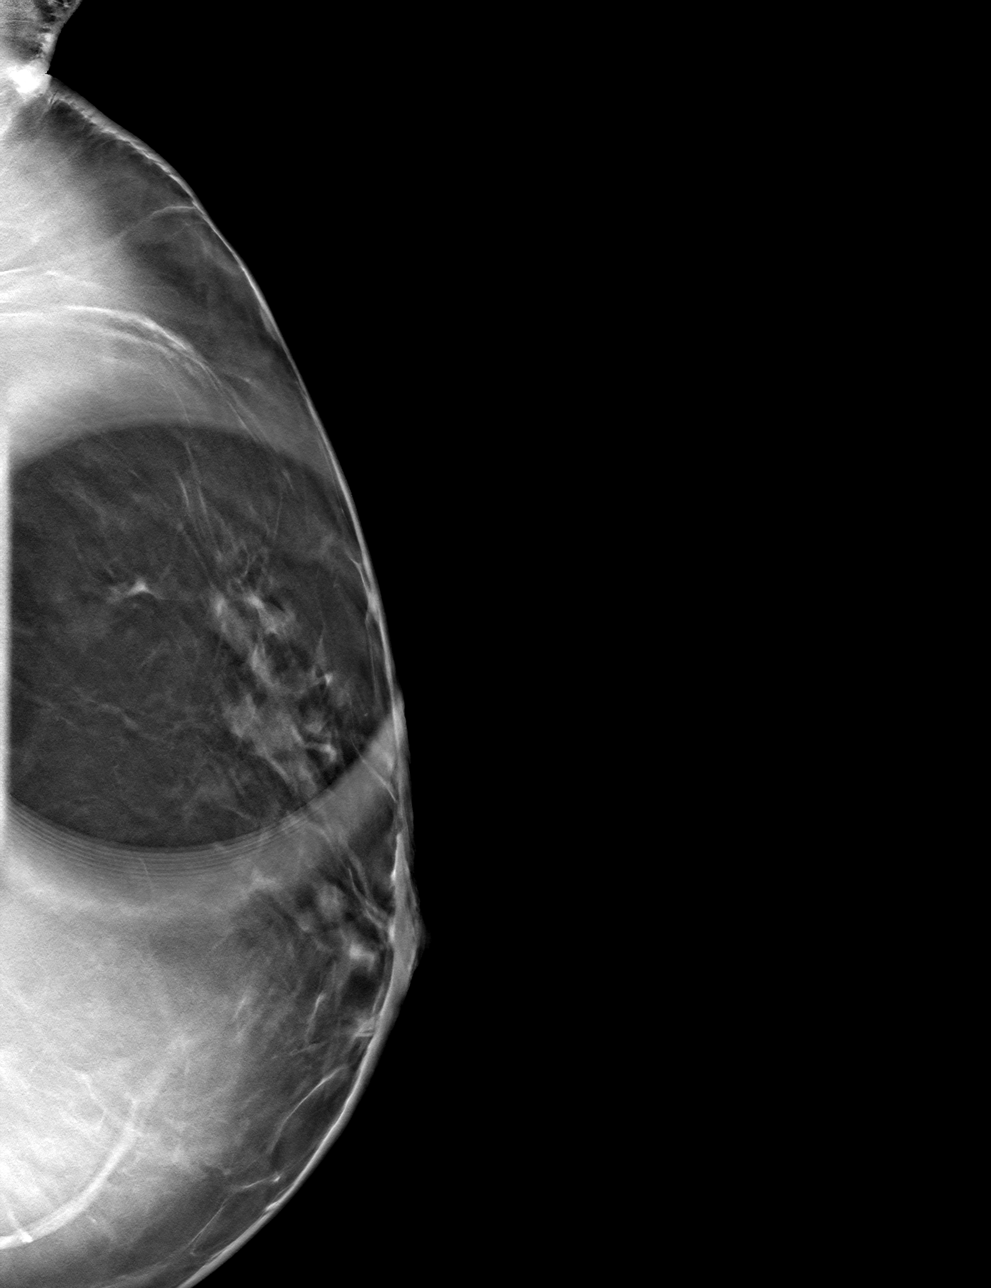

[4 of 12 positions shown; findings below may reference images not displayed]

ACR Breast Density Category b: There are scattered areas of
fibroglandular density.
FINDINGS: In the lateral aspect of the left breast, middle to anterior depth
there is a bilobed circumscribed oval mass.

Mammographic images were processed with CAD.

Ultrasound of the left breast at 3 o'clock, 2 cm from the nipple
demonstrates a bilobed anechoic oval circumscribed mass measuring 9
x 3 x 5 mm.
IMPRESSION: The left breast mass at 3 o'clock is consistent with a benign cyst.

RECOMMENDATION:
Screening mammogram in one year.(Code:[id number]-[id number])

I have discussed the findings and recommendations with the patient.
Results were also provided in writing at the conclusion of the
visit. If applicable, a reminder letter will be sent to the patient
regarding the next appointment.

BI-RADS CATEGORY  2: Benign.

## 2019-10-19 ENCOUNTER — Other Ambulatory Visit: Payer: Self-pay

## 2019-10-19 ENCOUNTER — Telehealth: Payer: Self-pay

## 2019-10-19 DIAGNOSIS — I10 Essential (primary) hypertension: Secondary | ICD-10-CM

## 2019-10-19 MED ORDER — HYDROCHLOROTHIAZIDE 25 MG PO TABS: ORAL_TABLET | ORAL | 0 refills | Status: AC

## 2019-10-19 NOTE — Telephone Encounter (Signed)
Hctz 25 mg #30 with 0 refills approved.  Pt needs annual exam/med refills scheduled on 11/18/2019 or after with [facility].

## 2019-10-21 NOTE — Telephone Encounter (Signed)
Called pt and sch. apt for 10/22/19

## 2019-10-24 ENCOUNTER — Telehealth (INDEPENDENT_AMBULATORY_CARE_PROVIDER_SITE_OTHER): Payer: Managed Care, Other (non HMO) | Admitting: Emergency Medicine

## 2019-10-24 ENCOUNTER — Other Ambulatory Visit: Payer: Self-pay

## 2019-10-24 ENCOUNTER — Telehealth: Payer: Self-pay | Admitting: Emergency Medicine

## 2019-10-24 ENCOUNTER — Encounter: Payer: Self-pay | Admitting: Emergency Medicine

## 2019-10-24 VITALS — Ht 67.0 in | Wt 320.0 lb

## 2019-10-24 DIAGNOSIS — M791 Myalgia, unspecified site: Secondary | ICD-10-CM

## 2019-10-24 DIAGNOSIS — R531 Weakness: Secondary | ICD-10-CM

## 2019-10-24 DIAGNOSIS — R5383 Other fatigue: Secondary | ICD-10-CM

## 2019-10-24 NOTE — Patient Instructions (Signed)
° ° ° °  If you have lab work done today you will be contacted with your lab results within the next 2 weeks.  If you have not heard from us then please contact us. The fastest way to get your results is to register for My Chart. ° ° °IF you received an x-ray today, you will receive an invoice from [facility]. Please contact [facility] at [phone] with questions or concerns regarding your invoice.  ° °IF you received labwork today, you will receive an invoice from [facility]. Please contact [facility] at [phone] with questions or concerns regarding your invoice.  ° °Our billing staff will not be able to assist you with questions regarding bills from these companies. ° °You will be contacted with the lab results as soon as they are available. The fastest way to get your results is to activate your My Chart account. Instructions are located on the last page of this paperwork. If you have not heard from us regarding the results in 2 weeks, please contact this office. °  ° ° ° °

## 2019-10-24 NOTE — Progress Notes (Signed)
Telemedicine Encounter- SOAP NOTE Established Patient MyChart video encounter. This video telephone encounter was conducted with the patient's (or proxy's) verbal consent via video audio telecommunications: yes/no: Yes Patient was instructed to have this encounter in a suitably private space; and to only have persons present to whom they give permission to participate. In addition, patient identity was confirmed by use of name plus two identifiers (DOB and address).  I discussed the limitations, risks, security and privacy concerns of performing an evaluation and management service by telephone and the availability of in person appointments. I also discussed with the patient that there may be a patient responsible charge related to this service. The patient expressed understanding and agreed to proceed.  I spent a total of TIME; 0 MIN TO 60 MIN: 15 minutes talking with the patient or their proxy.  Chief Complaint  Patient presents with  . Fatigue    unable to sleep - started about 1 1/2 - 2 weeks ago, pt tested twice for COVID 19 negative  . Dizziness    when standing per pt it started last Thursday  . Leg Pain    when climbing stairs or walking short distance    Subjective   Taylor Yang is a 44 y.o. female established patient. Telephone visit today complaining of fatigue for the past 2 weeks. Patient has a history of diabetes and hypertension.  Not taking Metformin.  Blood glucose at home around 200. States blood pressure readings have been normal. Mostly complaining of fatigue with generalized muscle aches, mostly her legs.  Has some dyspnea on exertion. Complains of increased stress at work.  Unable to sleep. Denies fever or chills, flulike symptoms, loss of taste or smell or any other significant symptoms. Denies urinary symptoms.  Able to eat and drink.  Denies nausea vomiting abdominal pain or diarrhea. States she has not been hydrating very well and when she does she  feels better. No other complaints or medical concerns today.  HPI   Patient Active Problem List   Diagnosis Date Noted  . Controlled type 2 diabetes mellitus without complication, without long-term current use of insulin ([place]) 07/30/2017  . Acute nonseasonal allergic rhinitis due to pollen 07/24/2016  . Prediabetes 07/24/2016  . Morbid obesity ([place]) 07/24/2016  . Essential hypertension 07/24/2016    Past Medical History:  Diagnosis Date  . Allergy   . Anxiety   . Hypertension     Current Outpatient Medications  Medication Sig Dispense Refill  . amLODipine (NORVASC) 10 MG tablet TAKE 1 TABLET(10 MG) BY MOUTH DAILY 90 tablet 1  . atorvastatin (LIPITOR) 10 MG tablet Take 1 tablet (10 mg total) by mouth daily. 90 tablet 3  . busPIRone (BUSPAR) 10 MG tablet Take 1 tablet (10 mg total) by mouth 2 (two) times daily. 60 tablet 11  . carvedilol (COREG) 12.5 MG tablet Take 1 tablet (12.5 mg total) by mouth 2 (two) times daily with a meal. 180 tablet 1  . hydrochlorothiazide (HYDRODIURIL) 25 MG tablet TAKE 1 TABLET(25 MG) BY MOUTH DAILY 30 tablet 0  . metFORMIN (GLUCOPHAGE) 500 MG tablet Take 1 tablet (500 mg total) by mouth 2 (two) times daily with a meal. 180 tablet 1  . PARoxetine (PAXIL) 20 MG tablet Take 1 tablet (20 mg total) by mouth daily. 90 tablet 3  . cetirizine (ZYRTEC) 10 MG tablet Take 1 tablet (10 mg total) by mouth daily. (Patient not taking: Reported on 10/24/2019) 30 tablet 11  . fluticasone (FLONASE)  50 MCG/ACT nasal spray Place 2 sprays into both nostrils daily. (Patient not taking: Reported on 10/24/2019) 16 g 6  . glucose blood test strip Use to check blood glucose daily and as needed. Use as instructed. E11.9 100 each 12  . losartan (COZAAR) 100 MG tablet TAKE 1 TABLET(100 MG) BY MOUTH DAILY (Patient not taking: Reported on 10/24/2019) 90 tablet 1   No current facility-administered medications for this visit.    Allergies  Allergen Reactions  . Sulfa Antibiotics [place]     Social History   Socioeconomic History  . Marital status: Single    Spouse name: Not on file  . Number of children: Not on file  . Years of education: Not on file  . Highest education level: Not on file  Occupational History  . Not on file  Tobacco Use  . Smoking status: Never Smoker  . Smokeless tobacco: Never Used  Substance and Sexual Activity  . Alcohol use: Yes    Alcohol/week: 5.0 standard drinks    Types: 5 Glasses of wine per week    Comment: glasses a month  . Drug use: No  . Sexual activity: Not on file  Other Topics Concern  . Not on file  Social History Narrative  . Not on file   Social Determinants of Health   Financial Resource Strain:   . Difficulty of Paying Living Expenses:   Food Insecurity:   . Worried About Charity fundraiser in the Last Year:   . Arboriculturist in the Last Year:   Transportation Needs:   . Film/video editor (Medical):   Marland Kitchen Lack of Transportation (Non-Medical):   Physical Activity:   . Days of Exercise per Week:   . Minutes of Exercise per Session:   Stress:   . Feeling of Stress :   Social Connections:   . Frequency of Communication with Friends and Family:   . Frequency of Social Gatherings with Friends and Family:   . Attends Religious Services:   . Active Member of Clubs or Organizations:   . Attends Archivist Meetings:   Marland Kitchen Marital Status:   Intimate Partner Violence:   . Fear of Current or Ex-Partner:   . Emotionally Abused:   Marland Kitchen Physically Abused:   . Sexually Abused:     ROS  Objective  Alert and oriented x3 in no apparent respiratory distress Vitals as reported by the patient: Today's Vitals   10/24/19 1613  Weight: (!) 320 lb (145.2 kg)  Height: 5\' 7"  (1.702 m)    There are no diagnoses linked to this encounter. Differential diagnosis discussed with patient.  Needs to be seen and examined in the office.  Needs blood work.  Will schedule office visit preferably with PCP this week.   Jurea was seen today for fatigue, dizziness and leg pain.  Diagnoses and all orders for this visit:  Fatigue, unspecified type  General weakness  Generalized muscle ache     I discussed the assessment and treatment plan with the patient. The patient was provided an opportunity to ask questions and all were answered. The patient agreed with the plan and demonstrated an understanding of the instructions.   The patient was advised to call back or seek an in-person evaluation if the symptoms worsen or if the condition fails to improve as anticipated.  I provided 15 minutes of non-face-to-face time during this encounter.  Horald Pollen, MD  Primary Care at [facility]

## 2019-10-25 NOTE — Telephone Encounter (Signed)
Spoke with pt and scheduled appt °

## 2019-11-09 ENCOUNTER — Ambulatory Visit: Payer: Self-pay | Admitting: Family Medicine

## 2019-11-14 ENCOUNTER — Other Ambulatory Visit: Payer: Self-pay | Admitting: Family Medicine

## 2019-11-14 DIAGNOSIS — I10 Essential (primary) hypertension: Secondary | ICD-10-CM

## 2019-11-14 NOTE — Telephone Encounter (Signed)
Last ordered 10/19/19 #30 with note-No further refills until OV.

## 2019-11-22 ENCOUNTER — Encounter: Payer: Managed Care, Other (non HMO) | Admitting: Family Medicine

## 2019-11-23 ENCOUNTER — Ambulatory Visit: Payer: Self-pay | Admitting: Family Medicine

## 2019-12-07 DIAGNOSIS — N289 Disorder of kidney and ureter, unspecified: Secondary | ICD-10-CM | POA: Diagnosis not present

## 2019-12-07 DIAGNOSIS — R5383 Other fatigue: Secondary | ICD-10-CM | POA: Diagnosis not present

## 2019-12-07 DIAGNOSIS — N920 Excessive and frequent menstruation with regular cycle: Secondary | ICD-10-CM | POA: Diagnosis not present

## 2019-12-07 DIAGNOSIS — K51312 Ulcerative (chronic) rectosigmoiditis with intestinal obstruction: Secondary | ICD-10-CM | POA: Diagnosis not present

## 2019-12-07 DIAGNOSIS — K51311 Ulcerative (chronic) rectosigmoiditis with rectal bleeding: Secondary | ICD-10-CM | POA: Diagnosis not present

## 2019-12-07 DIAGNOSIS — E860 Dehydration: Secondary | ICD-10-CM | POA: Diagnosis not present

## 2019-12-07 DIAGNOSIS — K6389 Other specified diseases of intestine: Secondary | ICD-10-CM | POA: Diagnosis not present

## 2019-12-07 DIAGNOSIS — E785 Hyperlipidemia, unspecified: Secondary | ICD-10-CM | POA: Diagnosis not present

## 2019-12-07 DIAGNOSIS — K621 Rectal polyp: Secondary | ICD-10-CM | POA: Diagnosis not present

## 2019-12-07 DIAGNOSIS — N800 Endometriosis of uterus: Secondary | ICD-10-CM | POA: Diagnosis not present

## 2019-12-07 DIAGNOSIS — R195 Other fecal abnormalities: Secondary | ICD-10-CM | POA: Diagnosis not present

## 2019-12-07 DIAGNOSIS — Z466 Encounter for fitting and adjustment of urinary device: Secondary | ICD-10-CM | POA: Diagnosis not present

## 2019-12-07 DIAGNOSIS — R188 Other ascites: Secondary | ICD-10-CM | POA: Diagnosis not present

## 2019-12-07 DIAGNOSIS — E119 Type 2 diabetes mellitus without complications: Secondary | ICD-10-CM | POA: Diagnosis not present

## 2019-12-07 DIAGNOSIS — Z8249 Family history of ischemic heart disease and other diseases of the circulatory system: Secondary | ICD-10-CM | POA: Diagnosis not present

## 2019-12-07 DIAGNOSIS — R509 Fever, unspecified: Secondary | ICD-10-CM | POA: Diagnosis not present

## 2019-12-07 DIAGNOSIS — K631 Perforation of intestine (nontraumatic): Secondary | ICD-10-CM | POA: Diagnosis not present

## 2019-12-07 DIAGNOSIS — N858 Other specified noninflammatory disorders of uterus: Secondary | ICD-10-CM | POA: Diagnosis not present

## 2019-12-07 DIAGNOSIS — Z5309 Procedure and treatment not carried out because of other contraindication: Secondary | ICD-10-CM | POA: Diagnosis not present

## 2019-12-07 DIAGNOSIS — J90 Pleural effusion, not elsewhere classified: Secondary | ICD-10-CM | POA: Diagnosis not present

## 2019-12-07 DIAGNOSIS — D128 Benign neoplasm of rectum: Secondary | ICD-10-CM | POA: Diagnosis not present

## 2019-12-07 DIAGNOSIS — D259 Leiomyoma of uterus, unspecified: Secondary | ICD-10-CM | POA: Diagnosis not present

## 2019-12-07 DIAGNOSIS — I10 Essential (primary) hypertension: Secondary | ICD-10-CM | POA: Diagnosis not present

## 2019-12-07 DIAGNOSIS — C20 Malignant neoplasm of rectum: Secondary | ICD-10-CM | POA: Diagnosis not present

## 2019-12-07 DIAGNOSIS — K3189 Other diseases of stomach and duodenum: Secondary | ICD-10-CM | POA: Diagnosis not present

## 2019-12-07 DIAGNOSIS — K635 Polyp of colon: Secondary | ICD-10-CM | POA: Diagnosis not present

## 2019-12-07 DIAGNOSIS — Z960 Presence of urogenital implants: Secondary | ICD-10-CM | POA: Diagnosis not present

## 2019-12-07 DIAGNOSIS — D72829 Elevated white blood cell count, unspecified: Secondary | ICD-10-CM | POA: Diagnosis not present

## 2019-12-07 DIAGNOSIS — K921 Melena: Secondary | ICD-10-CM | POA: Diagnosis not present

## 2019-12-07 DIAGNOSIS — D500 Iron deficiency anemia secondary to blood loss (chronic): Secondary | ICD-10-CM | POA: Diagnosis not present

## 2019-12-07 DIAGNOSIS — N852 Hypertrophy of uterus: Secondary | ICD-10-CM | POA: Diagnosis not present

## 2019-12-07 DIAGNOSIS — C787 Secondary malignant neoplasm of liver and intrahepatic bile duct: Secondary | ICD-10-CM | POA: Diagnosis not present

## 2019-12-07 DIAGNOSIS — D509 Iron deficiency anemia, unspecified: Secondary | ICD-10-CM | POA: Diagnosis not present

## 2019-12-07 DIAGNOSIS — K51318 Ulcerative (chronic) rectosigmoiditis with other complication: Secondary | ICD-10-CM | POA: Diagnosis not present

## 2019-12-07 DIAGNOSIS — K51314 Ulcerative (chronic) rectosigmoiditis with abscess: Secondary | ICD-10-CM | POA: Diagnosis not present

## 2019-12-07 DIAGNOSIS — C19 Malignant neoplasm of rectosigmoid junction: Secondary | ICD-10-CM | POA: Diagnosis not present

## 2019-12-07 DIAGNOSIS — K922 Gastrointestinal hemorrhage, unspecified: Secondary | ICD-10-CM | POA: Diagnosis not present

## 2019-12-07 DIAGNOSIS — K6289 Other specified diseases of anus and rectum: Secondary | ICD-10-CM | POA: Diagnosis not present

## 2019-12-07 DIAGNOSIS — R0600 Dyspnea, unspecified: Secondary | ICD-10-CM | POA: Diagnosis not present

## 2019-12-07 DIAGNOSIS — Z9889 Other specified postprocedural states: Secondary | ICD-10-CM | POA: Diagnosis not present

## 2019-12-07 DIAGNOSIS — K630 Abscess of intestine: Secondary | ICD-10-CM | POA: Diagnosis not present

## 2019-12-07 DIAGNOSIS — Z6841 Body Mass Index (BMI) 40.0 and over, adult: Secondary | ICD-10-CM | POA: Diagnosis not present

## 2019-12-07 DIAGNOSIS — G8918 Other acute postprocedural pain: Secondary | ICD-10-CM | POA: Diagnosis not present

## 2019-12-07 DIAGNOSIS — D649 Anemia, unspecified: Secondary | ICD-10-CM | POA: Diagnosis not present

## 2019-12-07 DIAGNOSIS — K2960 Other gastritis without bleeding: Secondary | ICD-10-CM | POA: Diagnosis not present

## 2019-12-07 DIAGNOSIS — Z7409 Other reduced mobility: Secondary | ICD-10-CM | POA: Diagnosis not present

## 2019-12-07 DIAGNOSIS — R42 Dizziness and giddiness: Secondary | ICD-10-CM | POA: Diagnosis not present

## 2019-12-07 DIAGNOSIS — D490 Neoplasm of unspecified behavior of digestive system: Secondary | ICD-10-CM | POA: Diagnosis not present

## 2019-12-07 DIAGNOSIS — K7689 Other specified diseases of liver: Secondary | ICD-10-CM | POA: Diagnosis not present

## 2019-12-07 DIAGNOSIS — N179 Acute kidney failure, unspecified: Secondary | ICD-10-CM | POA: Diagnosis not present

## 2019-12-08 DIAGNOSIS — K2960 Other gastritis without bleeding: Secondary | ICD-10-CM | POA: Diagnosis not present

## 2019-12-08 DIAGNOSIS — K621 Rectal polyp: Secondary | ICD-10-CM | POA: Diagnosis not present

## 2019-12-08 DIAGNOSIS — K922 Gastrointestinal hemorrhage, unspecified: Secondary | ICD-10-CM | POA: Diagnosis not present

## 2019-12-08 DIAGNOSIS — C20 Malignant neoplasm of rectum: Secondary | ICD-10-CM | POA: Diagnosis not present

## 2019-12-08 DIAGNOSIS — K921 Melena: Secondary | ICD-10-CM | POA: Diagnosis not present

## 2019-12-08 DIAGNOSIS — D72829 Elevated white blood cell count, unspecified: Secondary | ICD-10-CM | POA: Diagnosis not present

## 2019-12-08 DIAGNOSIS — K630 Abscess of intestine: Secondary | ICD-10-CM | POA: Diagnosis not present

## 2019-12-08 DIAGNOSIS — E119 Type 2 diabetes mellitus without complications: Secondary | ICD-10-CM | POA: Diagnosis not present

## 2019-12-08 DIAGNOSIS — D649 Anemia, unspecified: Secondary | ICD-10-CM | POA: Diagnosis not present

## 2019-12-08 DIAGNOSIS — K51314 Ulcerative (chronic) rectosigmoiditis with abscess: Secondary | ICD-10-CM | POA: Diagnosis not present

## 2019-12-08 DIAGNOSIS — D259 Leiomyoma of uterus, unspecified: Secondary | ICD-10-CM | POA: Diagnosis not present

## 2019-12-08 DIAGNOSIS — K51312 Ulcerative (chronic) rectosigmoiditis with intestinal obstruction: Secondary | ICD-10-CM | POA: Diagnosis not present

## 2019-12-08 DIAGNOSIS — Z5309 Procedure and treatment not carried out because of other contraindication: Secondary | ICD-10-CM | POA: Diagnosis not present

## 2019-12-08 DIAGNOSIS — K51311 Ulcerative (chronic) rectosigmoiditis with rectal bleeding: Secondary | ICD-10-CM | POA: Diagnosis not present

## 2019-12-08 DIAGNOSIS — D128 Benign neoplasm of rectum: Secondary | ICD-10-CM | POA: Diagnosis not present

## 2019-12-08 DIAGNOSIS — K3189 Other diseases of stomach and duodenum: Secondary | ICD-10-CM | POA: Diagnosis not present

## 2019-12-08 DIAGNOSIS — Z466 Encounter for fitting and adjustment of urinary device: Secondary | ICD-10-CM | POA: Diagnosis not present

## 2019-12-08 DIAGNOSIS — K51318 Ulcerative (chronic) rectosigmoiditis with other complication: Secondary | ICD-10-CM | POA: Diagnosis not present

## 2019-12-08 DIAGNOSIS — K631 Perforation of intestine (nontraumatic): Secondary | ICD-10-CM | POA: Diagnosis not present

## 2019-12-08 DIAGNOSIS — D509 Iron deficiency anemia, unspecified: Secondary | ICD-10-CM | POA: Diagnosis not present

## 2019-12-08 DIAGNOSIS — R509 Fever, unspecified: Secondary | ICD-10-CM | POA: Diagnosis not present

## 2019-12-08 DIAGNOSIS — D500 Iron deficiency anemia secondary to blood loss (chronic): Secondary | ICD-10-CM | POA: Diagnosis not present

## 2019-12-08 DIAGNOSIS — N920 Excessive and frequent menstruation with regular cycle: Secondary | ICD-10-CM | POA: Diagnosis not present

## 2019-12-08 DIAGNOSIS — I10 Essential (primary) hypertension: Secondary | ICD-10-CM | POA: Diagnosis not present

## 2019-12-08 DIAGNOSIS — K6389 Other specified diseases of intestine: Secondary | ICD-10-CM | POA: Diagnosis not present

## 2019-12-08 DIAGNOSIS — D490 Neoplasm of unspecified behavior of digestive system: Secondary | ICD-10-CM | POA: Diagnosis not present

## 2019-12-10 DIAGNOSIS — E119 Type 2 diabetes mellitus without complications: Secondary | ICD-10-CM | POA: Diagnosis not present

## 2019-12-10 DIAGNOSIS — D509 Iron deficiency anemia, unspecified: Secondary | ICD-10-CM | POA: Diagnosis not present

## 2019-12-10 DIAGNOSIS — J90 Pleural effusion, not elsewhere classified: Secondary | ICD-10-CM | POA: Diagnosis not present

## 2019-12-10 DIAGNOSIS — I10 Essential (primary) hypertension: Secondary | ICD-10-CM | POA: Diagnosis not present

## 2019-12-10 DIAGNOSIS — K921 Melena: Secondary | ICD-10-CM | POA: Diagnosis not present

## 2019-12-10 DIAGNOSIS — K922 Gastrointestinal hemorrhage, unspecified: Secondary | ICD-10-CM | POA: Diagnosis not present

## 2019-12-10 DIAGNOSIS — K51314 Ulcerative (chronic) rectosigmoiditis with abscess: Secondary | ICD-10-CM | POA: Diagnosis not present

## 2019-12-10 DIAGNOSIS — K6289 Other specified diseases of anus and rectum: Secondary | ICD-10-CM | POA: Diagnosis not present

## 2019-12-10 DIAGNOSIS — K7689 Other specified diseases of liver: Secondary | ICD-10-CM | POA: Diagnosis not present

## 2019-12-11 DIAGNOSIS — K631 Perforation of intestine (nontraumatic): Secondary | ICD-10-CM | POA: Diagnosis not present

## 2019-12-11 DIAGNOSIS — D490 Neoplasm of unspecified behavior of digestive system: Secondary | ICD-10-CM | POA: Diagnosis not present

## 2019-12-11 DIAGNOSIS — E119 Type 2 diabetes mellitus without complications: Secondary | ICD-10-CM | POA: Diagnosis not present

## 2019-12-11 DIAGNOSIS — I10 Essential (primary) hypertension: Secondary | ICD-10-CM | POA: Diagnosis not present

## 2019-12-11 DIAGNOSIS — K51311 Ulcerative (chronic) rectosigmoiditis with rectal bleeding: Secondary | ICD-10-CM | POA: Diagnosis not present

## 2019-12-11 DIAGNOSIS — K630 Abscess of intestine: Secondary | ICD-10-CM | POA: Diagnosis not present

## 2019-12-11 DIAGNOSIS — D259 Leiomyoma of uterus, unspecified: Secondary | ICD-10-CM | POA: Diagnosis not present

## 2019-12-11 DIAGNOSIS — K51312 Ulcerative (chronic) rectosigmoiditis with intestinal obstruction: Secondary | ICD-10-CM | POA: Diagnosis not present

## 2019-12-11 DIAGNOSIS — K922 Gastrointestinal hemorrhage, unspecified: Secondary | ICD-10-CM | POA: Diagnosis not present

## 2019-12-11 DIAGNOSIS — Z466 Encounter for fitting and adjustment of urinary device: Secondary | ICD-10-CM | POA: Diagnosis not present

## 2019-12-11 DIAGNOSIS — N920 Excessive and frequent menstruation with regular cycle: Secondary | ICD-10-CM | POA: Diagnosis not present

## 2019-12-11 DIAGNOSIS — K51314 Ulcerative (chronic) rectosigmoiditis with abscess: Secondary | ICD-10-CM | POA: Diagnosis not present

## 2019-12-11 DIAGNOSIS — K51318 Ulcerative (chronic) rectosigmoiditis with other complication: Secondary | ICD-10-CM | POA: Diagnosis not present

## 2019-12-11 DIAGNOSIS — D649 Anemia, unspecified: Secondary | ICD-10-CM | POA: Diagnosis not present

## 2019-12-11 DIAGNOSIS — N858 Other specified noninflammatory disorders of uterus: Secondary | ICD-10-CM | POA: Diagnosis not present

## 2019-12-12 DIAGNOSIS — K51314 Ulcerative (chronic) rectosigmoiditis with abscess: Secondary | ICD-10-CM | POA: Diagnosis not present

## 2019-12-12 DIAGNOSIS — D490 Neoplasm of unspecified behavior of digestive system: Secondary | ICD-10-CM | POA: Diagnosis not present

## 2019-12-12 DIAGNOSIS — D509 Iron deficiency anemia, unspecified: Secondary | ICD-10-CM | POA: Diagnosis not present

## 2019-12-12 DIAGNOSIS — Z960 Presence of urogenital implants: Secondary | ICD-10-CM | POA: Diagnosis not present

## 2019-12-12 DIAGNOSIS — K51312 Ulcerative (chronic) rectosigmoiditis with intestinal obstruction: Secondary | ICD-10-CM | POA: Diagnosis not present

## 2019-12-12 DIAGNOSIS — D259 Leiomyoma of uterus, unspecified: Secondary | ICD-10-CM | POA: Diagnosis not present

## 2019-12-12 DIAGNOSIS — G8918 Other acute postprocedural pain: Secondary | ICD-10-CM | POA: Diagnosis not present

## 2019-12-12 DIAGNOSIS — N852 Hypertrophy of uterus: Secondary | ICD-10-CM | POA: Diagnosis not present

## 2019-12-12 DIAGNOSIS — K51311 Ulcerative (chronic) rectosigmoiditis with rectal bleeding: Secondary | ICD-10-CM | POA: Diagnosis not present

## 2019-12-12 DIAGNOSIS — N800 Endometriosis of uterus: Secondary | ICD-10-CM | POA: Diagnosis not present

## 2019-12-12 DIAGNOSIS — R188 Other ascites: Secondary | ICD-10-CM | POA: Diagnosis not present

## 2019-12-12 DIAGNOSIS — C19 Malignant neoplasm of rectosigmoid junction: Secondary | ICD-10-CM | POA: Diagnosis not present

## 2019-12-12 DIAGNOSIS — K51318 Ulcerative (chronic) rectosigmoiditis with other complication: Secondary | ICD-10-CM | POA: Diagnosis not present

## 2019-12-13 DIAGNOSIS — D509 Iron deficiency anemia, unspecified: Secondary | ICD-10-CM | POA: Diagnosis not present

## 2019-12-13 DIAGNOSIS — K631 Perforation of intestine (nontraumatic): Secondary | ICD-10-CM | POA: Diagnosis not present

## 2019-12-13 DIAGNOSIS — K51314 Ulcerative (chronic) rectosigmoiditis with abscess: Secondary | ICD-10-CM | POA: Diagnosis not present

## 2019-12-13 DIAGNOSIS — D72829 Elevated white blood cell count, unspecified: Secondary | ICD-10-CM | POA: Diagnosis not present

## 2019-12-13 DIAGNOSIS — C19 Malignant neoplasm of rectosigmoid junction: Secondary | ICD-10-CM | POA: Diagnosis not present

## 2019-12-13 DIAGNOSIS — D500 Iron deficiency anemia secondary to blood loss (chronic): Secondary | ICD-10-CM | POA: Diagnosis not present

## 2019-12-13 DIAGNOSIS — N179 Acute kidney failure, unspecified: Secondary | ICD-10-CM | POA: Diagnosis not present

## 2019-12-13 DIAGNOSIS — Z9889 Other specified postprocedural states: Secondary | ICD-10-CM | POA: Diagnosis not present

## 2019-12-14 DIAGNOSIS — C19 Malignant neoplasm of rectosigmoid junction: Secondary | ICD-10-CM | POA: Diagnosis not present

## 2019-12-14 DIAGNOSIS — N289 Disorder of kidney and ureter, unspecified: Secondary | ICD-10-CM | POA: Diagnosis not present

## 2019-12-14 DIAGNOSIS — K51314 Ulcerative (chronic) rectosigmoiditis with abscess: Secondary | ICD-10-CM | POA: Diagnosis not present

## 2019-12-14 DIAGNOSIS — D72829 Elevated white blood cell count, unspecified: Secondary | ICD-10-CM | POA: Diagnosis not present

## 2019-12-14 DIAGNOSIS — N179 Acute kidney failure, unspecified: Secondary | ICD-10-CM | POA: Diagnosis not present

## 2019-12-14 DIAGNOSIS — R509 Fever, unspecified: Secondary | ICD-10-CM | POA: Diagnosis not present

## 2019-12-14 DIAGNOSIS — D509 Iron deficiency anemia, unspecified: Secondary | ICD-10-CM | POA: Diagnosis not present

## 2019-12-14 DIAGNOSIS — K631 Perforation of intestine (nontraumatic): Secondary | ICD-10-CM | POA: Diagnosis not present

## 2019-12-14 DIAGNOSIS — D500 Iron deficiency anemia secondary to blood loss (chronic): Secondary | ICD-10-CM | POA: Diagnosis not present

## 2019-12-14 DIAGNOSIS — Z9889 Other specified postprocedural states: Secondary | ICD-10-CM | POA: Diagnosis not present

## 2019-12-15 DIAGNOSIS — N179 Acute kidney failure, unspecified: Secondary | ICD-10-CM | POA: Diagnosis not present

## 2019-12-15 DIAGNOSIS — K51314 Ulcerative (chronic) rectosigmoiditis with abscess: Secondary | ICD-10-CM | POA: Diagnosis not present

## 2019-12-15 DIAGNOSIS — D509 Iron deficiency anemia, unspecified: Secondary | ICD-10-CM | POA: Diagnosis not present

## 2019-12-15 DIAGNOSIS — C19 Malignant neoplasm of rectosigmoid junction: Secondary | ICD-10-CM | POA: Diagnosis not present

## 2019-12-15 DIAGNOSIS — Z7409 Other reduced mobility: Secondary | ICD-10-CM | POA: Diagnosis not present

## 2019-12-15 DIAGNOSIS — D500 Iron deficiency anemia secondary to blood loss (chronic): Secondary | ICD-10-CM | POA: Diagnosis not present

## 2019-12-15 DIAGNOSIS — Z9889 Other specified postprocedural states: Secondary | ICD-10-CM | POA: Diagnosis not present

## 2019-12-15 DIAGNOSIS — D72829 Elevated white blood cell count, unspecified: Secondary | ICD-10-CM | POA: Diagnosis not present

## 2019-12-15 DIAGNOSIS — K631 Perforation of intestine (nontraumatic): Secondary | ICD-10-CM | POA: Diagnosis not present

## 2019-12-15 DIAGNOSIS — E119 Type 2 diabetes mellitus without complications: Secondary | ICD-10-CM | POA: Diagnosis not present

## 2019-12-21 DIAGNOSIS — Z483 Aftercare following surgery for neoplasm: Secondary | ICD-10-CM | POA: Diagnosis not present

## 2019-12-21 DIAGNOSIS — K631 Perforation of intestine (nontraumatic): Secondary | ICD-10-CM | POA: Diagnosis not present

## 2019-12-21 DIAGNOSIS — D630 Anemia in neoplastic disease: Secondary | ICD-10-CM | POA: Diagnosis not present

## 2019-12-21 DIAGNOSIS — K630 Abscess of intestine: Secondary | ICD-10-CM | POA: Diagnosis not present

## 2019-12-21 DIAGNOSIS — I10 Essential (primary) hypertension: Secondary | ICD-10-CM | POA: Diagnosis not present

## 2019-12-21 DIAGNOSIS — D62 Acute posthemorrhagic anemia: Secondary | ICD-10-CM | POA: Diagnosis not present

## 2019-12-21 DIAGNOSIS — F329 Major depressive disorder, single episode, unspecified: Secondary | ICD-10-CM | POA: Diagnosis not present

## 2019-12-21 DIAGNOSIS — E119 Type 2 diabetes mellitus without complications: Secondary | ICD-10-CM | POA: Diagnosis not present

## 2019-12-21 DIAGNOSIS — K6389 Other specified diseases of intestine: Secondary | ICD-10-CM | POA: Diagnosis not present

## 2019-12-21 DIAGNOSIS — Z7951 Long term (current) use of inhaled steroids: Secondary | ICD-10-CM | POA: Diagnosis not present

## 2019-12-21 DIAGNOSIS — Z433 Encounter for attention to colostomy: Secondary | ICD-10-CM | POA: Diagnosis not present

## 2019-12-21 DIAGNOSIS — D259 Leiomyoma of uterus, unspecified: Secondary | ICD-10-CM | POA: Diagnosis not present

## 2019-12-21 DIAGNOSIS — C19 Malignant neoplasm of rectosigmoid junction: Secondary | ICD-10-CM | POA: Diagnosis not present

## 2019-12-21 DIAGNOSIS — Z6841 Body Mass Index (BMI) 40.0 and over, adult: Secondary | ICD-10-CM | POA: Diagnosis not present

## 2019-12-23 DIAGNOSIS — K6389 Other specified diseases of intestine: Secondary | ICD-10-CM | POA: Diagnosis not present

## 2019-12-23 DIAGNOSIS — I10 Essential (primary) hypertension: Secondary | ICD-10-CM | POA: Diagnosis not present

## 2019-12-23 DIAGNOSIS — D630 Anemia in neoplastic disease: Secondary | ICD-10-CM | POA: Diagnosis not present

## 2019-12-23 DIAGNOSIS — K631 Perforation of intestine (nontraumatic): Secondary | ICD-10-CM | POA: Diagnosis not present

## 2019-12-23 DIAGNOSIS — D259 Leiomyoma of uterus, unspecified: Secondary | ICD-10-CM | POA: Diagnosis not present

## 2019-12-23 DIAGNOSIS — K630 Abscess of intestine: Secondary | ICD-10-CM | POA: Diagnosis not present

## 2019-12-23 DIAGNOSIS — Z6841 Body Mass Index (BMI) 40.0 and over, adult: Secondary | ICD-10-CM | POA: Diagnosis not present

## 2019-12-23 DIAGNOSIS — Z433 Encounter for attention to colostomy: Secondary | ICD-10-CM | POA: Diagnosis not present

## 2019-12-23 DIAGNOSIS — F329 Major depressive disorder, single episode, unspecified: Secondary | ICD-10-CM | POA: Diagnosis not present

## 2019-12-23 DIAGNOSIS — Z483 Aftercare following surgery for neoplasm: Secondary | ICD-10-CM | POA: Diagnosis not present

## 2019-12-23 DIAGNOSIS — C19 Malignant neoplasm of rectosigmoid junction: Secondary | ICD-10-CM | POA: Diagnosis not present

## 2019-12-23 DIAGNOSIS — E119 Type 2 diabetes mellitus without complications: Secondary | ICD-10-CM | POA: Diagnosis not present

## 2019-12-23 DIAGNOSIS — Z7951 Long term (current) use of inhaled steroids: Secondary | ICD-10-CM | POA: Diagnosis not present

## 2019-12-23 DIAGNOSIS — D62 Acute posthemorrhagic anemia: Secondary | ICD-10-CM | POA: Diagnosis not present

## 2019-12-26 DIAGNOSIS — C19 Malignant neoplasm of rectosigmoid junction: Secondary | ICD-10-CM | POA: Diagnosis not present

## 2019-12-26 DIAGNOSIS — I10 Essential (primary) hypertension: Secondary | ICD-10-CM | POA: Diagnosis not present

## 2019-12-26 DIAGNOSIS — Z483 Aftercare following surgery for neoplasm: Secondary | ICD-10-CM | POA: Diagnosis not present

## 2019-12-26 DIAGNOSIS — F329 Major depressive disorder, single episode, unspecified: Secondary | ICD-10-CM | POA: Diagnosis not present

## 2019-12-26 DIAGNOSIS — K631 Perforation of intestine (nontraumatic): Secondary | ICD-10-CM | POA: Diagnosis not present

## 2019-12-26 DIAGNOSIS — K6389 Other specified diseases of intestine: Secondary | ICD-10-CM | POA: Diagnosis not present

## 2019-12-26 DIAGNOSIS — Z6841 Body Mass Index (BMI) 40.0 and over, adult: Secondary | ICD-10-CM | POA: Diagnosis not present

## 2019-12-26 DIAGNOSIS — E119 Type 2 diabetes mellitus without complications: Secondary | ICD-10-CM | POA: Diagnosis not present

## 2019-12-26 DIAGNOSIS — D630 Anemia in neoplastic disease: Secondary | ICD-10-CM | POA: Diagnosis not present

## 2019-12-26 DIAGNOSIS — K630 Abscess of intestine: Secondary | ICD-10-CM | POA: Diagnosis not present

## 2019-12-26 DIAGNOSIS — Z433 Encounter for attention to colostomy: Secondary | ICD-10-CM | POA: Diagnosis not present

## 2019-12-26 DIAGNOSIS — D259 Leiomyoma of uterus, unspecified: Secondary | ICD-10-CM | POA: Diagnosis not present

## 2019-12-26 DIAGNOSIS — Z7951 Long term (current) use of inhaled steroids: Secondary | ICD-10-CM | POA: Diagnosis not present

## 2019-12-26 DIAGNOSIS — D62 Acute posthemorrhagic anemia: Secondary | ICD-10-CM | POA: Diagnosis not present

## 2019-12-28 DIAGNOSIS — K630 Abscess of intestine: Secondary | ICD-10-CM | POA: Diagnosis not present

## 2019-12-28 DIAGNOSIS — K631 Perforation of intestine (nontraumatic): Secondary | ICD-10-CM | POA: Diagnosis not present

## 2019-12-28 DIAGNOSIS — E119 Type 2 diabetes mellitus without complications: Secondary | ICD-10-CM | POA: Diagnosis not present

## 2019-12-28 DIAGNOSIS — D62 Acute posthemorrhagic anemia: Secondary | ICD-10-CM | POA: Diagnosis not present

## 2019-12-28 DIAGNOSIS — D630 Anemia in neoplastic disease: Secondary | ICD-10-CM | POA: Diagnosis not present

## 2019-12-28 DIAGNOSIS — Z433 Encounter for attention to colostomy: Secondary | ICD-10-CM | POA: Diagnosis not present

## 2019-12-28 DIAGNOSIS — K6389 Other specified diseases of intestine: Secondary | ICD-10-CM | POA: Diagnosis not present

## 2019-12-28 DIAGNOSIS — I10 Essential (primary) hypertension: Secondary | ICD-10-CM | POA: Diagnosis not present

## 2019-12-28 DIAGNOSIS — Z6841 Body Mass Index (BMI) 40.0 and over, adult: Secondary | ICD-10-CM | POA: Diagnosis not present

## 2019-12-28 DIAGNOSIS — C19 Malignant neoplasm of rectosigmoid junction: Secondary | ICD-10-CM | POA: Diagnosis not present

## 2019-12-28 DIAGNOSIS — Z7951 Long term (current) use of inhaled steroids: Secondary | ICD-10-CM | POA: Diagnosis not present

## 2019-12-28 DIAGNOSIS — D259 Leiomyoma of uterus, unspecified: Secondary | ICD-10-CM | POA: Diagnosis not present

## 2019-12-28 DIAGNOSIS — F329 Major depressive disorder, single episode, unspecified: Secondary | ICD-10-CM | POA: Diagnosis not present

## 2019-12-28 DIAGNOSIS — Z483 Aftercare following surgery for neoplasm: Secondary | ICD-10-CM | POA: Diagnosis not present

## 2019-12-29 DIAGNOSIS — K769 Liver disease, unspecified: Secondary | ICD-10-CM | POA: Diagnosis not present

## 2019-12-29 DIAGNOSIS — C20 Malignant neoplasm of rectum: Secondary | ICD-10-CM | POA: Diagnosis not present

## 2019-12-30 DIAGNOSIS — K631 Perforation of intestine (nontraumatic): Secondary | ICD-10-CM | POA: Diagnosis not present

## 2019-12-30 DIAGNOSIS — Z7951 Long term (current) use of inhaled steroids: Secondary | ICD-10-CM | POA: Diagnosis not present

## 2019-12-30 DIAGNOSIS — D630 Anemia in neoplastic disease: Secondary | ICD-10-CM | POA: Diagnosis not present

## 2019-12-30 DIAGNOSIS — K6389 Other specified diseases of intestine: Secondary | ICD-10-CM | POA: Diagnosis not present

## 2019-12-30 DIAGNOSIS — Z483 Aftercare following surgery for neoplasm: Secondary | ICD-10-CM | POA: Diagnosis not present

## 2019-12-30 DIAGNOSIS — Z433 Encounter for attention to colostomy: Secondary | ICD-10-CM | POA: Diagnosis not present

## 2019-12-30 DIAGNOSIS — C19 Malignant neoplasm of rectosigmoid junction: Secondary | ICD-10-CM | POA: Diagnosis not present

## 2019-12-30 DIAGNOSIS — I10 Essential (primary) hypertension: Secondary | ICD-10-CM | POA: Diagnosis not present

## 2019-12-30 DIAGNOSIS — F329 Major depressive disorder, single episode, unspecified: Secondary | ICD-10-CM | POA: Diagnosis not present

## 2019-12-30 DIAGNOSIS — K630 Abscess of intestine: Secondary | ICD-10-CM | POA: Diagnosis not present

## 2019-12-30 DIAGNOSIS — D259 Leiomyoma of uterus, unspecified: Secondary | ICD-10-CM | POA: Diagnosis not present

## 2019-12-30 DIAGNOSIS — E119 Type 2 diabetes mellitus without complications: Secondary | ICD-10-CM | POA: Diagnosis not present

## 2019-12-30 DIAGNOSIS — D62 Acute posthemorrhagic anemia: Secondary | ICD-10-CM | POA: Diagnosis not present

## 2019-12-30 DIAGNOSIS — Z6841 Body Mass Index (BMI) 40.0 and over, adult: Secondary | ICD-10-CM | POA: Diagnosis not present

## 2020-01-02 DIAGNOSIS — E119 Type 2 diabetes mellitus without complications: Secondary | ICD-10-CM | POA: Diagnosis not present

## 2020-01-02 DIAGNOSIS — D259 Leiomyoma of uterus, unspecified: Secondary | ICD-10-CM | POA: Diagnosis not present

## 2020-01-02 DIAGNOSIS — Z483 Aftercare following surgery for neoplasm: Secondary | ICD-10-CM | POA: Diagnosis not present

## 2020-01-02 DIAGNOSIS — K631 Perforation of intestine (nontraumatic): Secondary | ICD-10-CM | POA: Diagnosis not present

## 2020-01-02 DIAGNOSIS — Z6841 Body Mass Index (BMI) 40.0 and over, adult: Secondary | ICD-10-CM | POA: Diagnosis not present

## 2020-01-02 DIAGNOSIS — D630 Anemia in neoplastic disease: Secondary | ICD-10-CM | POA: Diagnosis not present

## 2020-01-02 DIAGNOSIS — C19 Malignant neoplasm of rectosigmoid junction: Secondary | ICD-10-CM | POA: Diagnosis not present

## 2020-01-02 DIAGNOSIS — I10 Essential (primary) hypertension: Secondary | ICD-10-CM | POA: Diagnosis not present

## 2020-01-02 DIAGNOSIS — K630 Abscess of intestine: Secondary | ICD-10-CM | POA: Diagnosis not present

## 2020-01-02 DIAGNOSIS — K6389 Other specified diseases of intestine: Secondary | ICD-10-CM | POA: Diagnosis not present

## 2020-01-02 DIAGNOSIS — D62 Acute posthemorrhagic anemia: Secondary | ICD-10-CM | POA: Diagnosis not present

## 2020-01-02 DIAGNOSIS — Z433 Encounter for attention to colostomy: Secondary | ICD-10-CM | POA: Diagnosis not present

## 2020-01-02 DIAGNOSIS — F329 Major depressive disorder, single episode, unspecified: Secondary | ICD-10-CM | POA: Diagnosis not present

## 2020-01-02 DIAGNOSIS — Z7951 Long term (current) use of inhaled steroids: Secondary | ICD-10-CM | POA: Diagnosis not present

## 2020-01-03 DIAGNOSIS — R935 Abnormal findings on diagnostic imaging of other abdominal regions, including retroperitoneum: Secondary | ICD-10-CM | POA: Diagnosis not present

## 2020-01-03 DIAGNOSIS — N3289 Other specified disorders of bladder: Secondary | ICD-10-CM | POA: Diagnosis not present

## 2020-01-03 DIAGNOSIS — Z933 Colostomy status: Secondary | ICD-10-CM | POA: Diagnosis not present

## 2020-01-05 DIAGNOSIS — I10 Essential (primary) hypertension: Secondary | ICD-10-CM | POA: Diagnosis not present

## 2020-01-05 DIAGNOSIS — Z6841 Body Mass Index (BMI) 40.0 and over, adult: Secondary | ICD-10-CM | POA: Diagnosis not present

## 2020-01-05 DIAGNOSIS — K630 Abscess of intestine: Secondary | ICD-10-CM | POA: Diagnosis not present

## 2020-01-05 DIAGNOSIS — C19 Malignant neoplasm of rectosigmoid junction: Secondary | ICD-10-CM | POA: Diagnosis not present

## 2020-01-05 DIAGNOSIS — F329 Major depressive disorder, single episode, unspecified: Secondary | ICD-10-CM | POA: Diagnosis not present

## 2020-01-05 DIAGNOSIS — K6389 Other specified diseases of intestine: Secondary | ICD-10-CM | POA: Diagnosis not present

## 2020-01-05 DIAGNOSIS — D62 Acute posthemorrhagic anemia: Secondary | ICD-10-CM | POA: Diagnosis not present

## 2020-01-05 DIAGNOSIS — K631 Perforation of intestine (nontraumatic): Secondary | ICD-10-CM | POA: Diagnosis not present

## 2020-01-05 DIAGNOSIS — Z433 Encounter for attention to colostomy: Secondary | ICD-10-CM | POA: Diagnosis not present

## 2020-01-05 DIAGNOSIS — E119 Type 2 diabetes mellitus without complications: Secondary | ICD-10-CM | POA: Diagnosis not present

## 2020-01-05 DIAGNOSIS — Z7951 Long term (current) use of inhaled steroids: Secondary | ICD-10-CM | POA: Diagnosis not present

## 2020-01-05 DIAGNOSIS — D630 Anemia in neoplastic disease: Secondary | ICD-10-CM | POA: Diagnosis not present

## 2020-01-05 DIAGNOSIS — Z483 Aftercare following surgery for neoplasm: Secondary | ICD-10-CM | POA: Diagnosis not present

## 2020-01-05 DIAGNOSIS — D259 Leiomyoma of uterus, unspecified: Secondary | ICD-10-CM | POA: Diagnosis not present

## 2020-01-10 DIAGNOSIS — D630 Anemia in neoplastic disease: Secondary | ICD-10-CM | POA: Diagnosis not present

## 2020-01-10 DIAGNOSIS — D62 Acute posthemorrhagic anemia: Secondary | ICD-10-CM | POA: Diagnosis not present

## 2020-01-10 DIAGNOSIS — K630 Abscess of intestine: Secondary | ICD-10-CM | POA: Diagnosis not present

## 2020-01-10 DIAGNOSIS — Z7951 Long term (current) use of inhaled steroids: Secondary | ICD-10-CM | POA: Diagnosis not present

## 2020-01-10 DIAGNOSIS — K631 Perforation of intestine (nontraumatic): Secondary | ICD-10-CM | POA: Diagnosis not present

## 2020-01-10 DIAGNOSIS — Z433 Encounter for attention to colostomy: Secondary | ICD-10-CM | POA: Diagnosis not present

## 2020-01-10 DIAGNOSIS — Z6841 Body Mass Index (BMI) 40.0 and over, adult: Secondary | ICD-10-CM | POA: Diagnosis not present

## 2020-01-10 DIAGNOSIS — E119 Type 2 diabetes mellitus without complications: Secondary | ICD-10-CM | POA: Diagnosis not present

## 2020-01-10 DIAGNOSIS — C19 Malignant neoplasm of rectosigmoid junction: Secondary | ICD-10-CM | POA: Diagnosis not present

## 2020-01-10 DIAGNOSIS — K6389 Other specified diseases of intestine: Secondary | ICD-10-CM | POA: Diagnosis not present

## 2020-01-10 DIAGNOSIS — I10 Essential (primary) hypertension: Secondary | ICD-10-CM | POA: Diagnosis not present

## 2020-01-10 DIAGNOSIS — D259 Leiomyoma of uterus, unspecified: Secondary | ICD-10-CM | POA: Diagnosis not present

## 2020-01-10 DIAGNOSIS — F329 Major depressive disorder, single episode, unspecified: Secondary | ICD-10-CM | POA: Diagnosis not present

## 2020-01-10 DIAGNOSIS — Z483 Aftercare following surgery for neoplasm: Secondary | ICD-10-CM | POA: Diagnosis not present

## 2020-01-11 DIAGNOSIS — Z20822 Contact with and (suspected) exposure to covid-19: Secondary | ICD-10-CM | POA: Diagnosis not present

## 2020-01-12 DIAGNOSIS — K6389 Other specified diseases of intestine: Secondary | ICD-10-CM | POA: Diagnosis not present

## 2020-01-12 DIAGNOSIS — E119 Type 2 diabetes mellitus without complications: Secondary | ICD-10-CM | POA: Diagnosis not present

## 2020-01-12 DIAGNOSIS — Z7951 Long term (current) use of inhaled steroids: Secondary | ICD-10-CM | POA: Diagnosis not present

## 2020-01-12 DIAGNOSIS — C19 Malignant neoplasm of rectosigmoid junction: Secondary | ICD-10-CM | POA: Diagnosis not present

## 2020-01-12 DIAGNOSIS — D630 Anemia in neoplastic disease: Secondary | ICD-10-CM | POA: Diagnosis not present

## 2020-01-12 DIAGNOSIS — Z6841 Body Mass Index (BMI) 40.0 and over, adult: Secondary | ICD-10-CM | POA: Diagnosis not present

## 2020-01-12 DIAGNOSIS — D259 Leiomyoma of uterus, unspecified: Secondary | ICD-10-CM | POA: Diagnosis not present

## 2020-01-12 DIAGNOSIS — Z483 Aftercare following surgery for neoplasm: Secondary | ICD-10-CM | POA: Diagnosis not present

## 2020-01-12 DIAGNOSIS — I10 Essential (primary) hypertension: Secondary | ICD-10-CM | POA: Diagnosis not present

## 2020-01-12 DIAGNOSIS — F329 Major depressive disorder, single episode, unspecified: Secondary | ICD-10-CM | POA: Diagnosis not present

## 2020-01-12 DIAGNOSIS — K630 Abscess of intestine: Secondary | ICD-10-CM | POA: Diagnosis not present

## 2020-01-12 DIAGNOSIS — D62 Acute posthemorrhagic anemia: Secondary | ICD-10-CM | POA: Diagnosis not present

## 2020-01-12 DIAGNOSIS — K631 Perforation of intestine (nontraumatic): Secondary | ICD-10-CM | POA: Diagnosis not present

## 2020-01-12 DIAGNOSIS — Z433 Encounter for attention to colostomy: Secondary | ICD-10-CM | POA: Diagnosis not present

## 2020-01-13 DIAGNOSIS — C189 Malignant neoplasm of colon, unspecified: Secondary | ICD-10-CM | POA: Diagnosis not present

## 2020-01-13 DIAGNOSIS — C187 Malignant neoplasm of sigmoid colon: Secondary | ICD-10-CM | POA: Diagnosis not present

## 2020-01-13 DIAGNOSIS — K7689 Other specified diseases of liver: Secondary | ICD-10-CM | POA: Diagnosis not present

## 2020-01-13 DIAGNOSIS — D134 Benign neoplasm of liver: Secondary | ICD-10-CM | POA: Diagnosis not present

## 2020-01-13 DIAGNOSIS — Z5111 Encounter for antineoplastic chemotherapy: Secondary | ICD-10-CM | POA: Diagnosis not present

## 2020-01-13 DIAGNOSIS — K8080 Other cholelithiasis without obstruction: Secondary | ICD-10-CM | POA: Diagnosis not present

## 2020-01-13 DIAGNOSIS — C20 Malignant neoplasm of rectum: Secondary | ICD-10-CM | POA: Diagnosis not present

## 2020-01-17 DIAGNOSIS — Z433 Encounter for attention to colostomy: Secondary | ICD-10-CM | POA: Diagnosis not present

## 2020-01-17 DIAGNOSIS — K6389 Other specified diseases of intestine: Secondary | ICD-10-CM | POA: Diagnosis not present

## 2020-01-17 DIAGNOSIS — F329 Major depressive disorder, single episode, unspecified: Secondary | ICD-10-CM | POA: Diagnosis not present

## 2020-01-17 DIAGNOSIS — I10 Essential (primary) hypertension: Secondary | ICD-10-CM | POA: Diagnosis not present

## 2020-01-17 DIAGNOSIS — D62 Acute posthemorrhagic anemia: Secondary | ICD-10-CM | POA: Diagnosis not present

## 2020-01-17 DIAGNOSIS — K631 Perforation of intestine (nontraumatic): Secondary | ICD-10-CM | POA: Diagnosis not present

## 2020-01-17 DIAGNOSIS — D630 Anemia in neoplastic disease: Secondary | ICD-10-CM | POA: Diagnosis not present

## 2020-01-17 DIAGNOSIS — C19 Malignant neoplasm of rectosigmoid junction: Secondary | ICD-10-CM | POA: Diagnosis not present

## 2020-01-17 DIAGNOSIS — E119 Type 2 diabetes mellitus without complications: Secondary | ICD-10-CM | POA: Diagnosis not present

## 2020-01-17 DIAGNOSIS — D259 Leiomyoma of uterus, unspecified: Secondary | ICD-10-CM | POA: Diagnosis not present

## 2020-01-17 DIAGNOSIS — Z483 Aftercare following surgery for neoplasm: Secondary | ICD-10-CM | POA: Diagnosis not present

## 2020-01-17 DIAGNOSIS — Z7951 Long term (current) use of inhaled steroids: Secondary | ICD-10-CM | POA: Diagnosis not present

## 2020-01-17 DIAGNOSIS — Z6841 Body Mass Index (BMI) 40.0 and over, adult: Secondary | ICD-10-CM | POA: Diagnosis not present

## 2020-01-17 DIAGNOSIS — K630 Abscess of intestine: Secondary | ICD-10-CM | POA: Diagnosis not present

## 2020-01-18 DIAGNOSIS — C187 Malignant neoplasm of sigmoid colon: Secondary | ICD-10-CM | POA: Diagnosis not present

## 2020-01-18 DIAGNOSIS — Z452 Encounter for adjustment and management of vascular access device: Secondary | ICD-10-CM | POA: Diagnosis not present

## 2020-01-18 DIAGNOSIS — C189 Malignant neoplasm of colon, unspecified: Secondary | ICD-10-CM | POA: Diagnosis not present

## 2020-01-19 DIAGNOSIS — E119 Type 2 diabetes mellitus without complications: Secondary | ICD-10-CM | POA: Diagnosis not present

## 2020-01-19 DIAGNOSIS — E7800 Pure hypercholesterolemia, unspecified: Secondary | ICD-10-CM | POA: Diagnosis not present

## 2020-01-19 DIAGNOSIS — F411 Generalized anxiety disorder: Secondary | ICD-10-CM | POA: Diagnosis not present

## 2020-01-19 DIAGNOSIS — C187 Malignant neoplasm of sigmoid colon: Secondary | ICD-10-CM | POA: Diagnosis not present

## 2020-01-19 DIAGNOSIS — I10 Essential (primary) hypertension: Secondary | ICD-10-CM | POA: Diagnosis not present

## 2020-01-20 DIAGNOSIS — Z483 Aftercare following surgery for neoplasm: Secondary | ICD-10-CM | POA: Diagnosis not present

## 2020-01-20 DIAGNOSIS — Z433 Encounter for attention to colostomy: Secondary | ICD-10-CM | POA: Diagnosis not present

## 2020-01-20 DIAGNOSIS — I10 Essential (primary) hypertension: Secondary | ICD-10-CM | POA: Diagnosis not present

## 2020-01-20 DIAGNOSIS — D630 Anemia in neoplastic disease: Secondary | ICD-10-CM | POA: Diagnosis not present

## 2020-01-20 DIAGNOSIS — C19 Malignant neoplasm of rectosigmoid junction: Secondary | ICD-10-CM | POA: Diagnosis not present

## 2020-01-20 DIAGNOSIS — E119 Type 2 diabetes mellitus without complications: Secondary | ICD-10-CM | POA: Diagnosis not present

## 2020-01-24 DIAGNOSIS — I10 Essential (primary) hypertension: Secondary | ICD-10-CM | POA: Diagnosis not present

## 2020-01-24 DIAGNOSIS — K769 Liver disease, unspecified: Secondary | ICD-10-CM | POA: Diagnosis not present

## 2020-01-24 DIAGNOSIS — Z800 Family history of malignant neoplasm of digestive organs: Secondary | ICD-10-CM | POA: Diagnosis not present

## 2020-01-24 DIAGNOSIS — E119 Type 2 diabetes mellitus without complications: Secondary | ICD-10-CM | POA: Diagnosis not present

## 2020-01-24 DIAGNOSIS — Z79899 Other long term (current) drug therapy: Secondary | ICD-10-CM | POA: Diagnosis not present

## 2020-01-24 DIAGNOSIS — Z5111 Encounter for antineoplastic chemotherapy: Secondary | ICD-10-CM | POA: Diagnosis not present

## 2020-01-24 DIAGNOSIS — C187 Malignant neoplasm of sigmoid colon: Secondary | ICD-10-CM | POA: Diagnosis not present

## 2020-01-24 DIAGNOSIS — C189 Malignant neoplasm of colon, unspecified: Secondary | ICD-10-CM | POA: Diagnosis not present

## 2020-01-24 DIAGNOSIS — K8020 Calculus of gallbladder without cholecystitis without obstruction: Secondary | ICD-10-CM | POA: Diagnosis not present

## 2020-01-24 DIAGNOSIS — R160 Hepatomegaly, not elsewhere classified: Secondary | ICD-10-CM | POA: Diagnosis not present

## 2020-01-26 DIAGNOSIS — Z433 Encounter for attention to colostomy: Secondary | ICD-10-CM | POA: Diagnosis not present

## 2020-01-26 DIAGNOSIS — I10 Essential (primary) hypertension: Secondary | ICD-10-CM | POA: Diagnosis not present

## 2020-01-26 DIAGNOSIS — D630 Anemia in neoplastic disease: Secondary | ICD-10-CM | POA: Diagnosis not present

## 2020-01-26 DIAGNOSIS — Z483 Aftercare following surgery for neoplasm: Secondary | ICD-10-CM | POA: Diagnosis not present

## 2020-01-26 DIAGNOSIS — E119 Type 2 diabetes mellitus without complications: Secondary | ICD-10-CM | POA: Diagnosis not present

## 2020-01-26 DIAGNOSIS — C19 Malignant neoplasm of rectosigmoid junction: Secondary | ICD-10-CM | POA: Diagnosis not present

## 2020-01-27 DIAGNOSIS — Z483 Aftercare following surgery for neoplasm: Secondary | ICD-10-CM | POA: Diagnosis not present

## 2020-01-27 DIAGNOSIS — I340 Nonrheumatic mitral (valve) insufficiency: Secondary | ICD-10-CM | POA: Diagnosis not present

## 2020-01-27 DIAGNOSIS — I10 Essential (primary) hypertension: Secondary | ICD-10-CM | POA: Diagnosis not present

## 2020-01-27 DIAGNOSIS — C19 Malignant neoplasm of rectosigmoid junction: Secondary | ICD-10-CM | POA: Diagnosis not present

## 2020-01-27 DIAGNOSIS — D630 Anemia in neoplastic disease: Secondary | ICD-10-CM | POA: Diagnosis not present

## 2020-01-27 DIAGNOSIS — I272 Pulmonary hypertension, unspecified: Secondary | ICD-10-CM | POA: Diagnosis not present

## 2020-01-27 DIAGNOSIS — Z433 Encounter for attention to colostomy: Secondary | ICD-10-CM | POA: Diagnosis not present

## 2020-01-27 DIAGNOSIS — E119 Type 2 diabetes mellitus without complications: Secondary | ICD-10-CM | POA: Diagnosis not present

## 2020-02-02 DIAGNOSIS — D630 Anemia in neoplastic disease: Secondary | ICD-10-CM | POA: Diagnosis not present

## 2020-02-02 DIAGNOSIS — E119 Type 2 diabetes mellitus without complications: Secondary | ICD-10-CM | POA: Diagnosis not present

## 2020-02-02 DIAGNOSIS — I10 Essential (primary) hypertension: Secondary | ICD-10-CM | POA: Diagnosis not present

## 2020-02-02 DIAGNOSIS — Z483 Aftercare following surgery for neoplasm: Secondary | ICD-10-CM | POA: Diagnosis not present

## 2020-02-02 DIAGNOSIS — C19 Malignant neoplasm of rectosigmoid junction: Secondary | ICD-10-CM | POA: Diagnosis not present

## 2020-02-02 DIAGNOSIS — Z433 Encounter for attention to colostomy: Secondary | ICD-10-CM | POA: Diagnosis not present

## 2020-02-03 ENCOUNTER — Other Ambulatory Visit: Payer: Self-pay

## 2020-02-03 MED ORDER — CARVEDILOL 12.5 MG PO TABS: 12.5000 mg | ORAL_TABLET | Freq: Two times a day (BID) | ORAL | 0 refills | Status: AC

## 2020-02-06 DIAGNOSIS — C187 Malignant neoplasm of sigmoid colon: Secondary | ICD-10-CM | POA: Diagnosis not present

## 2020-02-06 DIAGNOSIS — Z5111 Encounter for antineoplastic chemotherapy: Secondary | ICD-10-CM | POA: Diagnosis not present

## 2020-02-06 DIAGNOSIS — Z09 Encounter for follow-up examination after completed treatment for conditions other than malignant neoplasm: Secondary | ICD-10-CM | POA: Diagnosis not present

## 2020-02-07 DIAGNOSIS — C187 Malignant neoplasm of sigmoid colon: Secondary | ICD-10-CM | POA: Diagnosis not present

## 2020-02-07 DIAGNOSIS — Z5111 Encounter for antineoplastic chemotherapy: Secondary | ICD-10-CM | POA: Diagnosis not present

## 2020-02-08 DIAGNOSIS — Z933 Colostomy status: Secondary | ICD-10-CM | POA: Diagnosis not present

## 2020-02-08 DIAGNOSIS — Z85038 Personal history of other malignant neoplasm of large intestine: Secondary | ICD-10-CM | POA: Diagnosis not present

## 2020-02-08 DIAGNOSIS — C20 Malignant neoplasm of rectum: Secondary | ICD-10-CM | POA: Diagnosis not present

## 2020-02-10 DIAGNOSIS — Z433 Encounter for attention to colostomy: Secondary | ICD-10-CM | POA: Diagnosis not present

## 2020-02-10 DIAGNOSIS — I10 Essential (primary) hypertension: Secondary | ICD-10-CM | POA: Diagnosis not present

## 2020-02-10 DIAGNOSIS — E119 Type 2 diabetes mellitus without complications: Secondary | ICD-10-CM | POA: Diagnosis not present

## 2020-02-10 DIAGNOSIS — Z483 Aftercare following surgery for neoplasm: Secondary | ICD-10-CM | POA: Diagnosis not present

## 2020-02-10 DIAGNOSIS — C19 Malignant neoplasm of rectosigmoid junction: Secondary | ICD-10-CM | POA: Diagnosis not present

## 2020-02-10 DIAGNOSIS — D630 Anemia in neoplastic disease: Secondary | ICD-10-CM | POA: Diagnosis not present

## 2020-02-16 DIAGNOSIS — D630 Anemia in neoplastic disease: Secondary | ICD-10-CM | POA: Diagnosis not present

## 2020-02-16 DIAGNOSIS — I10 Essential (primary) hypertension: Secondary | ICD-10-CM | POA: Diagnosis not present

## 2020-02-16 DIAGNOSIS — C19 Malignant neoplasm of rectosigmoid junction: Secondary | ICD-10-CM | POA: Diagnosis not present

## 2020-02-16 DIAGNOSIS — Z433 Encounter for attention to colostomy: Secondary | ICD-10-CM | POA: Diagnosis not present

## 2020-02-16 DIAGNOSIS — Z483 Aftercare following surgery for neoplasm: Secondary | ICD-10-CM | POA: Diagnosis not present

## 2020-02-16 DIAGNOSIS — E119 Type 2 diabetes mellitus without complications: Secondary | ICD-10-CM | POA: Diagnosis not present

## 2020-02-21 DIAGNOSIS — C187 Malignant neoplasm of sigmoid colon: Secondary | ICD-10-CM | POA: Diagnosis not present

## 2020-02-21 DIAGNOSIS — Z5111 Encounter for antineoplastic chemotherapy: Secondary | ICD-10-CM | POA: Diagnosis not present

## 2020-03-01 DIAGNOSIS — D630 Anemia in neoplastic disease: Secondary | ICD-10-CM | POA: Diagnosis not present

## 2020-03-01 DIAGNOSIS — C19 Malignant neoplasm of rectosigmoid junction: Secondary | ICD-10-CM | POA: Diagnosis not present

## 2020-03-01 DIAGNOSIS — I10 Essential (primary) hypertension: Secondary | ICD-10-CM | POA: Diagnosis not present

## 2020-03-01 DIAGNOSIS — Z483 Aftercare following surgery for neoplasm: Secondary | ICD-10-CM | POA: Diagnosis not present

## 2020-03-01 DIAGNOSIS — Z433 Encounter for attention to colostomy: Secondary | ICD-10-CM | POA: Diagnosis not present

## 2020-03-01 DIAGNOSIS — E119 Type 2 diabetes mellitus without complications: Secondary | ICD-10-CM | POA: Diagnosis not present

## 2020-03-06 DIAGNOSIS — Z79899 Other long term (current) drug therapy: Secondary | ICD-10-CM | POA: Diagnosis not present

## 2020-03-06 DIAGNOSIS — I10 Essential (primary) hypertension: Secondary | ICD-10-CM | POA: Diagnosis not present

## 2020-03-06 DIAGNOSIS — Z90710 Acquired absence of both cervix and uterus: Secondary | ICD-10-CM | POA: Diagnosis not present

## 2020-03-06 DIAGNOSIS — Z933 Colostomy status: Secondary | ICD-10-CM | POA: Diagnosis not present

## 2020-03-06 DIAGNOSIS — Z5111 Encounter for antineoplastic chemotherapy: Secondary | ICD-10-CM | POA: Diagnosis not present

## 2020-03-06 DIAGNOSIS — C187 Malignant neoplasm of sigmoid colon: Secondary | ICD-10-CM | POA: Diagnosis not present

## 2020-03-06 DIAGNOSIS — Z9049 Acquired absence of other specified parts of digestive tract: Secondary | ICD-10-CM | POA: Diagnosis not present

## 2020-03-06 DIAGNOSIS — E119 Type 2 diabetes mellitus without complications: Secondary | ICD-10-CM | POA: Diagnosis not present

## 2020-03-06 DIAGNOSIS — Z09 Encounter for follow-up examination after completed treatment for conditions other than malignant neoplasm: Secondary | ICD-10-CM | POA: Diagnosis not present

## 2020-03-06 DIAGNOSIS — K769 Liver disease, unspecified: Secondary | ICD-10-CM | POA: Diagnosis not present

## 2020-03-06 DIAGNOSIS — Z90722 Acquired absence of ovaries, bilateral: Secondary | ICD-10-CM | POA: Diagnosis not present

## 2020-03-07 DIAGNOSIS — Z933 Colostomy status: Secondary | ICD-10-CM | POA: Diagnosis not present

## 2020-03-07 DIAGNOSIS — C20 Malignant neoplasm of rectum: Secondary | ICD-10-CM | POA: Diagnosis not present

## 2020-03-07 DIAGNOSIS — Z85038 Personal history of other malignant neoplasm of large intestine: Secondary | ICD-10-CM | POA: Diagnosis not present

## 2020-03-08 DIAGNOSIS — E7800 Pure hypercholesterolemia, unspecified: Secondary | ICD-10-CM | POA: Diagnosis not present

## 2020-03-08 DIAGNOSIS — E119 Type 2 diabetes mellitus without complications: Secondary | ICD-10-CM | POA: Diagnosis not present

## 2020-03-09 DIAGNOSIS — K7689 Other specified diseases of liver: Secondary | ICD-10-CM | POA: Diagnosis not present

## 2020-03-09 DIAGNOSIS — Z433 Encounter for attention to colostomy: Secondary | ICD-10-CM | POA: Diagnosis not present

## 2020-03-09 DIAGNOSIS — Z483 Aftercare following surgery for neoplasm: Secondary | ICD-10-CM | POA: Diagnosis not present

## 2020-03-09 DIAGNOSIS — E119 Type 2 diabetes mellitus without complications: Secondary | ICD-10-CM | POA: Diagnosis not present

## 2020-03-09 DIAGNOSIS — I10 Essential (primary) hypertension: Secondary | ICD-10-CM | POA: Diagnosis not present

## 2020-03-09 DIAGNOSIS — D630 Anemia in neoplastic disease: Secondary | ICD-10-CM | POA: Diagnosis not present

## 2020-03-09 DIAGNOSIS — R748 Abnormal levels of other serum enzymes: Secondary | ICD-10-CM | POA: Diagnosis not present

## 2020-03-09 DIAGNOSIS — C19 Malignant neoplasm of rectosigmoid junction: Secondary | ICD-10-CM | POA: Diagnosis not present

## 2020-03-16 DIAGNOSIS — C187 Malignant neoplasm of sigmoid colon: Secondary | ICD-10-CM | POA: Diagnosis not present

## 2020-03-19 DIAGNOSIS — C187 Malignant neoplasm of sigmoid colon: Secondary | ICD-10-CM | POA: Diagnosis not present

## 2020-03-20 DIAGNOSIS — C187 Malignant neoplasm of sigmoid colon: Secondary | ICD-10-CM | POA: Diagnosis not present

## 2020-03-20 DIAGNOSIS — Z5111 Encounter for antineoplastic chemotherapy: Secondary | ICD-10-CM | POA: Diagnosis not present

## 2020-03-21 DIAGNOSIS — M6281 Muscle weakness (generalized): Secondary | ICD-10-CM | POA: Diagnosis not present

## 2020-03-21 DIAGNOSIS — C187 Malignant neoplasm of sigmoid colon: Secondary | ICD-10-CM | POA: Diagnosis not present

## 2020-03-21 DIAGNOSIS — N8189 Other female genital prolapse: Secondary | ICD-10-CM | POA: Diagnosis not present

## 2020-04-03 DIAGNOSIS — Z09 Encounter for follow-up examination after completed treatment for conditions other than malignant neoplasm: Secondary | ICD-10-CM | POA: Diagnosis not present

## 2020-04-03 DIAGNOSIS — Z9049 Acquired absence of other specified parts of digestive tract: Secondary | ICD-10-CM | POA: Diagnosis not present

## 2020-04-03 DIAGNOSIS — C19 Malignant neoplasm of rectosigmoid junction: Secondary | ICD-10-CM | POA: Diagnosis not present

## 2020-04-03 DIAGNOSIS — Z5111 Encounter for antineoplastic chemotherapy: Secondary | ICD-10-CM | POA: Diagnosis not present

## 2020-04-03 DIAGNOSIS — Z933 Colostomy status: Secondary | ICD-10-CM | POA: Diagnosis not present

## 2020-04-03 DIAGNOSIS — C187 Malignant neoplasm of sigmoid colon: Secondary | ICD-10-CM | POA: Diagnosis not present

## 2020-04-03 DIAGNOSIS — K7689 Other specified diseases of liver: Secondary | ICD-10-CM | POA: Diagnosis not present

## 2020-04-09 DIAGNOSIS — H04123 Dry eye syndrome of bilateral lacrimal glands: Secondary | ICD-10-CM | POA: Diagnosis not present

## 2020-04-09 DIAGNOSIS — E119 Type 2 diabetes mellitus without complications: Secondary | ICD-10-CM | POA: Diagnosis not present

## 2020-04-09 DIAGNOSIS — C189 Malignant neoplasm of colon, unspecified: Secondary | ICD-10-CM | POA: Diagnosis not present

## 2020-04-09 DIAGNOSIS — H1045 Other chronic allergic conjunctivitis: Secondary | ICD-10-CM | POA: Diagnosis not present

## 2020-04-12 DIAGNOSIS — C187 Malignant neoplasm of sigmoid colon: Secondary | ICD-10-CM | POA: Diagnosis not present

## 2020-04-12 DIAGNOSIS — C20 Malignant neoplasm of rectum: Secondary | ICD-10-CM | POA: Diagnosis not present

## 2020-04-12 DIAGNOSIS — Z933 Colostomy status: Secondary | ICD-10-CM | POA: Diagnosis not present

## 2020-04-12 DIAGNOSIS — N8189 Other female genital prolapse: Secondary | ICD-10-CM | POA: Diagnosis not present

## 2020-04-12 DIAGNOSIS — Z85038 Personal history of other malignant neoplasm of large intestine: Secondary | ICD-10-CM | POA: Diagnosis not present

## 2020-04-12 DIAGNOSIS — M6281 Muscle weakness (generalized): Secondary | ICD-10-CM | POA: Diagnosis not present

## 2020-04-17 DIAGNOSIS — K8080 Other cholelithiasis without obstruction: Secondary | ICD-10-CM | POA: Diagnosis not present

## 2020-04-17 DIAGNOSIS — C187 Malignant neoplasm of sigmoid colon: Secondary | ICD-10-CM | POA: Diagnosis not present

## 2020-04-17 DIAGNOSIS — K769 Liver disease, unspecified: Secondary | ICD-10-CM | POA: Diagnosis not present

## 2020-04-17 DIAGNOSIS — D134 Benign neoplasm of liver: Secondary | ICD-10-CM | POA: Diagnosis not present

## 2020-04-17 DIAGNOSIS — I700 Atherosclerosis of aorta: Secondary | ICD-10-CM | POA: Diagnosis not present

## 2020-04-17 DIAGNOSIS — K8020 Calculus of gallbladder without cholecystitis without obstruction: Secondary | ICD-10-CM | POA: Diagnosis not present

## 2020-04-17 DIAGNOSIS — K7689 Other specified diseases of liver: Secondary | ICD-10-CM | POA: Diagnosis not present

## 2020-04-17 DIAGNOSIS — K760 Fatty (change of) liver, not elsewhere classified: Secondary | ICD-10-CM | POA: Diagnosis not present

## 2020-04-18 DIAGNOSIS — Z90710 Acquired absence of both cervix and uterus: Secondary | ICD-10-CM | POA: Diagnosis not present

## 2020-04-18 DIAGNOSIS — E119 Type 2 diabetes mellitus without complications: Secondary | ICD-10-CM | POA: Diagnosis not present

## 2020-04-18 DIAGNOSIS — C19 Malignant neoplasm of rectosigmoid junction: Secondary | ICD-10-CM | POA: Diagnosis not present

## 2020-04-18 DIAGNOSIS — Z9049 Acquired absence of other specified parts of digestive tract: Secondary | ICD-10-CM | POA: Diagnosis not present

## 2020-04-18 DIAGNOSIS — C187 Malignant neoplasm of sigmoid colon: Secondary | ICD-10-CM | POA: Diagnosis not present

## 2020-04-18 DIAGNOSIS — Z960 Presence of urogenital implants: Secondary | ICD-10-CM | POA: Diagnosis not present

## 2020-04-18 DIAGNOSIS — Z5111 Encounter for antineoplastic chemotherapy: Secondary | ICD-10-CM | POA: Diagnosis not present

## 2020-04-18 DIAGNOSIS — Z09 Encounter for follow-up examination after completed treatment for conditions other than malignant neoplasm: Secondary | ICD-10-CM | POA: Diagnosis not present

## 2020-04-18 DIAGNOSIS — Z90722 Acquired absence of ovaries, bilateral: Secondary | ICD-10-CM | POA: Diagnosis not present

## 2020-04-18 DIAGNOSIS — Z9089 Acquired absence of other organs: Secondary | ICD-10-CM | POA: Diagnosis not present

## 2020-04-18 DIAGNOSIS — I10 Essential (primary) hypertension: Secondary | ICD-10-CM | POA: Diagnosis not present

## 2020-04-20 DIAGNOSIS — F411 Generalized anxiety disorder: Secondary | ICD-10-CM | POA: Diagnosis not present

## 2020-04-20 DIAGNOSIS — E7800 Pure hypercholesterolemia, unspecified: Secondary | ICD-10-CM | POA: Diagnosis not present

## 2020-04-20 DIAGNOSIS — I10 Essential (primary) hypertension: Secondary | ICD-10-CM | POA: Diagnosis not present

## 2020-04-20 DIAGNOSIS — E119 Type 2 diabetes mellitus without complications: Secondary | ICD-10-CM | POA: Diagnosis not present

## 2020-05-10 DIAGNOSIS — Z85038 Personal history of other malignant neoplasm of large intestine: Secondary | ICD-10-CM | POA: Diagnosis not present

## 2020-05-10 DIAGNOSIS — C20 Malignant neoplasm of rectum: Secondary | ICD-10-CM | POA: Diagnosis not present

## 2020-05-10 DIAGNOSIS — Z933 Colostomy status: Secondary | ICD-10-CM | POA: Diagnosis not present

## 2020-05-15 ENCOUNTER — Other Ambulatory Visit: Payer: Self-pay | Admitting: Emergency Medicine

## 2020-05-16 DIAGNOSIS — N8189 Other female genital prolapse: Secondary | ICD-10-CM | POA: Diagnosis not present

## 2020-05-16 DIAGNOSIS — C187 Malignant neoplasm of sigmoid colon: Secondary | ICD-10-CM | POA: Diagnosis not present

## 2020-05-16 DIAGNOSIS — M6281 Muscle weakness (generalized): Secondary | ICD-10-CM | POA: Diagnosis not present

## 2020-05-17 DIAGNOSIS — C19 Malignant neoplasm of rectosigmoid junction: Secondary | ICD-10-CM | POA: Diagnosis not present

## 2020-05-17 DIAGNOSIS — C187 Malignant neoplasm of sigmoid colon: Secondary | ICD-10-CM | POA: Diagnosis not present

## 2020-05-25 DIAGNOSIS — C187 Malignant neoplasm of sigmoid colon: Secondary | ICD-10-CM | POA: Diagnosis not present

## 2020-05-28 DIAGNOSIS — C187 Malignant neoplasm of sigmoid colon: Secondary | ICD-10-CM | POA: Diagnosis not present

## 2020-05-28 DIAGNOSIS — C19 Malignant neoplasm of rectosigmoid junction: Secondary | ICD-10-CM | POA: Diagnosis not present

## 2020-05-30 DIAGNOSIS — C19 Malignant neoplasm of rectosigmoid junction: Secondary | ICD-10-CM | POA: Diagnosis not present

## 2020-05-30 DIAGNOSIS — E7800 Pure hypercholesterolemia, unspecified: Secondary | ICD-10-CM | POA: Diagnosis not present

## 2020-05-30 DIAGNOSIS — C187 Malignant neoplasm of sigmoid colon: Secondary | ICD-10-CM | POA: Diagnosis not present

## 2020-05-30 DIAGNOSIS — E119 Type 2 diabetes mellitus without complications: Secondary | ICD-10-CM | POA: Diagnosis not present

## 2020-05-30 DIAGNOSIS — Z510 Encounter for antineoplastic radiation therapy: Secondary | ICD-10-CM | POA: Diagnosis not present

## 2020-05-31 DIAGNOSIS — Z510 Encounter for antineoplastic radiation therapy: Secondary | ICD-10-CM | POA: Diagnosis not present

## 2020-05-31 DIAGNOSIS — C187 Malignant neoplasm of sigmoid colon: Secondary | ICD-10-CM | POA: Diagnosis not present

## 2020-05-31 DIAGNOSIS — C19 Malignant neoplasm of rectosigmoid junction: Secondary | ICD-10-CM | POA: Diagnosis not present

## 2020-06-01 DIAGNOSIS — C187 Malignant neoplasm of sigmoid colon: Secondary | ICD-10-CM | POA: Diagnosis not present

## 2020-06-01 DIAGNOSIS — E7800 Pure hypercholesterolemia, unspecified: Secondary | ICD-10-CM | POA: Diagnosis not present

## 2020-06-01 DIAGNOSIS — Z510 Encounter for antineoplastic radiation therapy: Secondary | ICD-10-CM | POA: Diagnosis not present

## 2020-06-01 DIAGNOSIS — I10 Essential (primary) hypertension: Secondary | ICD-10-CM | POA: Diagnosis not present

## 2020-06-01 DIAGNOSIS — E119 Type 2 diabetes mellitus without complications: Secondary | ICD-10-CM | POA: Diagnosis not present

## 2020-06-01 DIAGNOSIS — R160 Hepatomegaly, not elsewhere classified: Secondary | ICD-10-CM | POA: Diagnosis not present

## 2020-06-01 DIAGNOSIS — C19 Malignant neoplasm of rectosigmoid junction: Secondary | ICD-10-CM | POA: Diagnosis not present

## 2020-06-01 DIAGNOSIS — Z23 Encounter for immunization: Secondary | ICD-10-CM | POA: Diagnosis not present

## 2020-06-04 DIAGNOSIS — Z510 Encounter for antineoplastic radiation therapy: Secondary | ICD-10-CM | POA: Diagnosis not present

## 2020-06-04 DIAGNOSIS — C20 Malignant neoplasm of rectum: Secondary | ICD-10-CM | POA: Diagnosis not present

## 2020-06-04 DIAGNOSIS — C187 Malignant neoplasm of sigmoid colon: Secondary | ICD-10-CM | POA: Diagnosis not present

## 2020-06-04 DIAGNOSIS — C19 Malignant neoplasm of rectosigmoid junction: Secondary | ICD-10-CM | POA: Diagnosis not present

## 2020-06-05 DIAGNOSIS — C187 Malignant neoplasm of sigmoid colon: Secondary | ICD-10-CM | POA: Diagnosis not present

## 2020-06-05 DIAGNOSIS — C19 Malignant neoplasm of rectosigmoid junction: Secondary | ICD-10-CM | POA: Diagnosis not present

## 2020-06-05 DIAGNOSIS — C20 Malignant neoplasm of rectum: Secondary | ICD-10-CM | POA: Diagnosis not present

## 2020-06-05 DIAGNOSIS — Z510 Encounter for antineoplastic radiation therapy: Secondary | ICD-10-CM | POA: Diagnosis not present

## 2020-06-06 DIAGNOSIS — C19 Malignant neoplasm of rectosigmoid junction: Secondary | ICD-10-CM | POA: Diagnosis not present

## 2020-06-06 DIAGNOSIS — C187 Malignant neoplasm of sigmoid colon: Secondary | ICD-10-CM | POA: Diagnosis not present

## 2020-06-06 DIAGNOSIS — C20 Malignant neoplasm of rectum: Secondary | ICD-10-CM | POA: Diagnosis not present

## 2020-06-06 DIAGNOSIS — Z510 Encounter for antineoplastic radiation therapy: Secondary | ICD-10-CM | POA: Diagnosis not present

## 2020-06-07 DIAGNOSIS — Z510 Encounter for antineoplastic radiation therapy: Secondary | ICD-10-CM | POA: Diagnosis not present

## 2020-06-07 DIAGNOSIS — C187 Malignant neoplasm of sigmoid colon: Secondary | ICD-10-CM | POA: Diagnosis not present

## 2020-06-07 DIAGNOSIS — C19 Malignant neoplasm of rectosigmoid junction: Secondary | ICD-10-CM | POA: Diagnosis not present

## 2020-06-08 DIAGNOSIS — C187 Malignant neoplasm of sigmoid colon: Secondary | ICD-10-CM | POA: Diagnosis not present

## 2020-06-08 DIAGNOSIS — C19 Malignant neoplasm of rectosigmoid junction: Secondary | ICD-10-CM | POA: Diagnosis not present

## 2020-06-08 DIAGNOSIS — Z510 Encounter for antineoplastic radiation therapy: Secondary | ICD-10-CM | POA: Diagnosis not present

## 2020-06-11 DIAGNOSIS — Z5111 Encounter for antineoplastic chemotherapy: Secondary | ICD-10-CM | POA: Diagnosis not present

## 2020-06-11 DIAGNOSIS — Z85038 Personal history of other malignant neoplasm of large intestine: Secondary | ICD-10-CM | POA: Diagnosis not present

## 2020-06-11 DIAGNOSIS — R197 Diarrhea, unspecified: Secondary | ICD-10-CM | POA: Diagnosis not present

## 2020-06-11 DIAGNOSIS — C19 Malignant neoplasm of rectosigmoid junction: Secondary | ICD-10-CM | POA: Diagnosis not present

## 2020-06-11 DIAGNOSIS — Z510 Encounter for antineoplastic radiation therapy: Secondary | ICD-10-CM | POA: Diagnosis not present

## 2020-06-11 DIAGNOSIS — Z933 Colostomy status: Secondary | ICD-10-CM | POA: Diagnosis not present

## 2020-06-11 DIAGNOSIS — C187 Malignant neoplasm of sigmoid colon: Secondary | ICD-10-CM | POA: Diagnosis not present

## 2020-06-11 DIAGNOSIS — C20 Malignant neoplasm of rectum: Secondary | ICD-10-CM | POA: Diagnosis not present

## 2020-06-11 DIAGNOSIS — Z79899 Other long term (current) drug therapy: Secondary | ICD-10-CM | POA: Diagnosis not present

## 2020-06-12 DIAGNOSIS — R197 Diarrhea, unspecified: Secondary | ICD-10-CM | POA: Diagnosis not present

## 2020-06-12 DIAGNOSIS — C19 Malignant neoplasm of rectosigmoid junction: Secondary | ICD-10-CM | POA: Diagnosis not present

## 2020-06-12 DIAGNOSIS — Z510 Encounter for antineoplastic radiation therapy: Secondary | ICD-10-CM | POA: Diagnosis not present

## 2020-06-12 DIAGNOSIS — C187 Malignant neoplasm of sigmoid colon: Secondary | ICD-10-CM | POA: Diagnosis not present

## 2020-06-13 DIAGNOSIS — Z510 Encounter for antineoplastic radiation therapy: Secondary | ICD-10-CM | POA: Diagnosis not present

## 2020-06-13 DIAGNOSIS — C187 Malignant neoplasm of sigmoid colon: Secondary | ICD-10-CM | POA: Diagnosis not present

## 2020-06-13 DIAGNOSIS — R197 Diarrhea, unspecified: Secondary | ICD-10-CM | POA: Diagnosis not present

## 2020-06-13 DIAGNOSIS — C19 Malignant neoplasm of rectosigmoid junction: Secondary | ICD-10-CM | POA: Diagnosis not present

## 2020-06-14 DIAGNOSIS — Z510 Encounter for antineoplastic radiation therapy: Secondary | ICD-10-CM | POA: Diagnosis not present

## 2020-06-14 DIAGNOSIS — C187 Malignant neoplasm of sigmoid colon: Secondary | ICD-10-CM | POA: Diagnosis not present

## 2020-06-14 DIAGNOSIS — R197 Diarrhea, unspecified: Secondary | ICD-10-CM | POA: Diagnosis not present

## 2020-06-14 DIAGNOSIS — C19 Malignant neoplasm of rectosigmoid junction: Secondary | ICD-10-CM | POA: Diagnosis not present

## 2020-06-15 DIAGNOSIS — R197 Diarrhea, unspecified: Secondary | ICD-10-CM | POA: Diagnosis not present

## 2020-06-15 DIAGNOSIS — C19 Malignant neoplasm of rectosigmoid junction: Secondary | ICD-10-CM | POA: Diagnosis not present

## 2020-06-15 DIAGNOSIS — C187 Malignant neoplasm of sigmoid colon: Secondary | ICD-10-CM | POA: Diagnosis not present

## 2020-06-15 DIAGNOSIS — Z510 Encounter for antineoplastic radiation therapy: Secondary | ICD-10-CM | POA: Diagnosis not present

## 2020-06-18 DIAGNOSIS — Z510 Encounter for antineoplastic radiation therapy: Secondary | ICD-10-CM | POA: Diagnosis not present

## 2020-06-18 DIAGNOSIS — C19 Malignant neoplasm of rectosigmoid junction: Secondary | ICD-10-CM | POA: Diagnosis not present

## 2020-06-18 DIAGNOSIS — R197 Diarrhea, unspecified: Secondary | ICD-10-CM | POA: Diagnosis not present

## 2020-06-18 DIAGNOSIS — C187 Malignant neoplasm of sigmoid colon: Secondary | ICD-10-CM | POA: Diagnosis not present

## 2020-06-19 DIAGNOSIS — C187 Malignant neoplasm of sigmoid colon: Secondary | ICD-10-CM | POA: Diagnosis not present

## 2020-06-19 DIAGNOSIS — R197 Diarrhea, unspecified: Secondary | ICD-10-CM | POA: Diagnosis not present

## 2020-06-19 DIAGNOSIS — C19 Malignant neoplasm of rectosigmoid junction: Secondary | ICD-10-CM | POA: Diagnosis not present

## 2020-06-19 DIAGNOSIS — Z510 Encounter for antineoplastic radiation therapy: Secondary | ICD-10-CM | POA: Diagnosis not present

## 2020-06-20 DIAGNOSIS — R197 Diarrhea, unspecified: Secondary | ICD-10-CM | POA: Diagnosis not present

## 2020-06-20 DIAGNOSIS — C19 Malignant neoplasm of rectosigmoid junction: Secondary | ICD-10-CM | POA: Diagnosis not present

## 2020-06-20 DIAGNOSIS — Z510 Encounter for antineoplastic radiation therapy: Secondary | ICD-10-CM | POA: Diagnosis not present

## 2020-06-20 DIAGNOSIS — C187 Malignant neoplasm of sigmoid colon: Secondary | ICD-10-CM | POA: Diagnosis not present

## 2020-06-21 DIAGNOSIS — R197 Diarrhea, unspecified: Secondary | ICD-10-CM | POA: Diagnosis not present

## 2020-06-21 DIAGNOSIS — Z510 Encounter for antineoplastic radiation therapy: Secondary | ICD-10-CM | POA: Diagnosis not present

## 2020-06-21 DIAGNOSIS — C187 Malignant neoplasm of sigmoid colon: Secondary | ICD-10-CM | POA: Diagnosis not present

## 2020-06-21 DIAGNOSIS — C19 Malignant neoplasm of rectosigmoid junction: Secondary | ICD-10-CM | POA: Diagnosis not present

## 2020-06-22 DIAGNOSIS — Z510 Encounter for antineoplastic radiation therapy: Secondary | ICD-10-CM | POA: Diagnosis not present

## 2020-06-22 DIAGNOSIS — R197 Diarrhea, unspecified: Secondary | ICD-10-CM | POA: Diagnosis not present

## 2020-06-22 DIAGNOSIS — C19 Malignant neoplasm of rectosigmoid junction: Secondary | ICD-10-CM | POA: Diagnosis not present

## 2020-06-22 DIAGNOSIS — C187 Malignant neoplasm of sigmoid colon: Secondary | ICD-10-CM | POA: Diagnosis not present

## 2020-06-25 DIAGNOSIS — R197 Diarrhea, unspecified: Secondary | ICD-10-CM | POA: Diagnosis not present

## 2020-06-25 DIAGNOSIS — C187 Malignant neoplasm of sigmoid colon: Secondary | ICD-10-CM | POA: Diagnosis not present

## 2020-06-25 DIAGNOSIS — C19 Malignant neoplasm of rectosigmoid junction: Secondary | ICD-10-CM | POA: Diagnosis not present

## 2020-06-25 DIAGNOSIS — Z510 Encounter for antineoplastic radiation therapy: Secondary | ICD-10-CM | POA: Diagnosis not present

## 2020-06-26 DIAGNOSIS — C19 Malignant neoplasm of rectosigmoid junction: Secondary | ICD-10-CM | POA: Diagnosis not present

## 2020-06-26 DIAGNOSIS — R197 Diarrhea, unspecified: Secondary | ICD-10-CM | POA: Diagnosis not present

## 2020-06-26 DIAGNOSIS — C20 Malignant neoplasm of rectum: Secondary | ICD-10-CM | POA: Diagnosis not present

## 2020-06-26 DIAGNOSIS — Z510 Encounter for antineoplastic radiation therapy: Secondary | ICD-10-CM | POA: Diagnosis not present

## 2020-06-26 DIAGNOSIS — C187 Malignant neoplasm of sigmoid colon: Secondary | ICD-10-CM | POA: Diagnosis not present

## 2020-06-27 DIAGNOSIS — C19 Malignant neoplasm of rectosigmoid junction: Secondary | ICD-10-CM | POA: Diagnosis not present

## 2020-06-27 DIAGNOSIS — R197 Diarrhea, unspecified: Secondary | ICD-10-CM | POA: Diagnosis not present

## 2020-06-27 DIAGNOSIS — C187 Malignant neoplasm of sigmoid colon: Secondary | ICD-10-CM | POA: Diagnosis not present

## 2020-06-27 DIAGNOSIS — Z510 Encounter for antineoplastic radiation therapy: Secondary | ICD-10-CM | POA: Diagnosis not present

## 2020-06-28 DIAGNOSIS — Z510 Encounter for antineoplastic radiation therapy: Secondary | ICD-10-CM | POA: Diagnosis not present

## 2020-06-28 DIAGNOSIS — C19 Malignant neoplasm of rectosigmoid junction: Secondary | ICD-10-CM | POA: Diagnosis not present

## 2020-06-28 DIAGNOSIS — R197 Diarrhea, unspecified: Secondary | ICD-10-CM | POA: Diagnosis not present

## 2020-06-28 DIAGNOSIS — C187 Malignant neoplasm of sigmoid colon: Secondary | ICD-10-CM | POA: Diagnosis not present

## 2020-06-29 DIAGNOSIS — Z510 Encounter for antineoplastic radiation therapy: Secondary | ICD-10-CM | POA: Diagnosis not present

## 2020-06-29 DIAGNOSIS — C19 Malignant neoplasm of rectosigmoid junction: Secondary | ICD-10-CM | POA: Diagnosis not present

## 2020-06-29 DIAGNOSIS — C187 Malignant neoplasm of sigmoid colon: Secondary | ICD-10-CM | POA: Diagnosis not present

## 2020-06-29 DIAGNOSIS — C20 Malignant neoplasm of rectum: Secondary | ICD-10-CM | POA: Diagnosis not present

## 2020-06-29 DIAGNOSIS — R197 Diarrhea, unspecified: Secondary | ICD-10-CM | POA: Diagnosis not present

## 2020-07-02 DIAGNOSIS — C187 Malignant neoplasm of sigmoid colon: Secondary | ICD-10-CM | POA: Diagnosis not present

## 2020-07-02 DIAGNOSIS — Z510 Encounter for antineoplastic radiation therapy: Secondary | ICD-10-CM | POA: Diagnosis not present

## 2020-07-02 DIAGNOSIS — R197 Diarrhea, unspecified: Secondary | ICD-10-CM | POA: Diagnosis not present

## 2020-07-02 DIAGNOSIS — C19 Malignant neoplasm of rectosigmoid junction: Secondary | ICD-10-CM | POA: Diagnosis not present

## 2020-07-02 DIAGNOSIS — C20 Malignant neoplasm of rectum: Secondary | ICD-10-CM | POA: Diagnosis not present

## 2020-07-03 DIAGNOSIS — Z510 Encounter for antineoplastic radiation therapy: Secondary | ICD-10-CM | POA: Diagnosis not present

## 2020-07-03 DIAGNOSIS — R197 Diarrhea, unspecified: Secondary | ICD-10-CM | POA: Diagnosis not present

## 2020-07-03 DIAGNOSIS — C187 Malignant neoplasm of sigmoid colon: Secondary | ICD-10-CM | POA: Diagnosis not present

## 2020-07-03 DIAGNOSIS — C19 Malignant neoplasm of rectosigmoid junction: Secondary | ICD-10-CM | POA: Diagnosis not present

## 2020-07-04 DIAGNOSIS — R197 Diarrhea, unspecified: Secondary | ICD-10-CM | POA: Diagnosis not present

## 2020-07-04 DIAGNOSIS — C19 Malignant neoplasm of rectosigmoid junction: Secondary | ICD-10-CM | POA: Diagnosis not present

## 2020-07-04 DIAGNOSIS — C187 Malignant neoplasm of sigmoid colon: Secondary | ICD-10-CM | POA: Diagnosis not present

## 2020-07-04 DIAGNOSIS — Z510 Encounter for antineoplastic radiation therapy: Secondary | ICD-10-CM | POA: Diagnosis not present

## 2020-07-06 DIAGNOSIS — Z510 Encounter for antineoplastic radiation therapy: Secondary | ICD-10-CM | POA: Diagnosis not present

## 2020-07-06 DIAGNOSIS — C19 Malignant neoplasm of rectosigmoid junction: Secondary | ICD-10-CM | POA: Diagnosis not present

## 2020-07-06 DIAGNOSIS — R197 Diarrhea, unspecified: Secondary | ICD-10-CM | POA: Diagnosis not present

## 2020-07-06 DIAGNOSIS — C187 Malignant neoplasm of sigmoid colon: Secondary | ICD-10-CM | POA: Diagnosis not present

## 2020-07-09 DIAGNOSIS — C19 Malignant neoplasm of rectosigmoid junction: Secondary | ICD-10-CM | POA: Diagnosis not present

## 2020-07-09 DIAGNOSIS — I10 Essential (primary) hypertension: Secondary | ICD-10-CM | POA: Diagnosis not present

## 2020-07-09 DIAGNOSIS — K7689 Other specified diseases of liver: Secondary | ICD-10-CM | POA: Diagnosis not present

## 2020-07-09 DIAGNOSIS — T451X5A Adverse effect of antineoplastic and immunosuppressive drugs, initial encounter: Secondary | ICD-10-CM | POA: Diagnosis not present

## 2020-07-09 DIAGNOSIS — Z510 Encounter for antineoplastic radiation therapy: Secondary | ICD-10-CM | POA: Diagnosis not present

## 2020-07-09 DIAGNOSIS — E119 Type 2 diabetes mellitus without complications: Secondary | ICD-10-CM | POA: Diagnosis not present

## 2020-07-09 DIAGNOSIS — G620 Drug-induced polyneuropathy: Secondary | ICD-10-CM | POA: Diagnosis not present

## 2020-07-09 DIAGNOSIS — Z79899 Other long term (current) drug therapy: Secondary | ICD-10-CM | POA: Diagnosis not present

## 2020-07-09 DIAGNOSIS — C187 Malignant neoplasm of sigmoid colon: Secondary | ICD-10-CM | POA: Diagnosis not present

## 2020-07-09 DIAGNOSIS — R197 Diarrhea, unspecified: Secondary | ICD-10-CM | POA: Diagnosis not present

## 2020-07-11 DIAGNOSIS — Z85038 Personal history of other malignant neoplasm of large intestine: Secondary | ICD-10-CM | POA: Diagnosis not present

## 2020-07-11 DIAGNOSIS — C20 Malignant neoplasm of rectum: Secondary | ICD-10-CM | POA: Diagnosis not present

## 2020-07-11 DIAGNOSIS — Z933 Colostomy status: Secondary | ICD-10-CM | POA: Diagnosis not present

## 2020-07-26 DIAGNOSIS — G629 Polyneuropathy, unspecified: Secondary | ICD-10-CM | POA: Diagnosis not present

## 2020-07-26 DIAGNOSIS — T451X5A Adverse effect of antineoplastic and immunosuppressive drugs, initial encounter: Secondary | ICD-10-CM | POA: Diagnosis not present

## 2020-07-26 DIAGNOSIS — Z09 Encounter for follow-up examination after completed treatment for conditions other than malignant neoplasm: Secondary | ICD-10-CM | POA: Diagnosis not present

## 2020-07-26 DIAGNOSIS — C19 Malignant neoplasm of rectosigmoid junction: Secondary | ICD-10-CM | POA: Diagnosis not present

## 2020-07-26 DIAGNOSIS — C187 Malignant neoplasm of sigmoid colon: Secondary | ICD-10-CM | POA: Diagnosis not present

## 2020-07-26 DIAGNOSIS — Z79899 Other long term (current) drug therapy: Secondary | ICD-10-CM | POA: Diagnosis not present

## 2023-12-28 ENCOUNTER — Other Ambulatory Visit (HOSPITAL_BASED_OUTPATIENT_CLINIC_OR_DEPARTMENT_OTHER): Payer: Self-pay

## 2023-12-28 MED ORDER — MOUNJARO 12.5 MG/0.5ML ~~LOC~~ SOAJ
12.5000 mg | SUBCUTANEOUS | 10 refills | Status: AC
  Filled 2023-12-28: qty 2, 28d supply, fill #0
  Filled 2024-01-20: qty 2, 28d supply, fill #1
  Filled 2024-02-22: qty 2, 28d supply, fill #2
  Filled 2024-03-17: qty 2, 28d supply, fill #3
  Filled 2024-04-17: qty 2, 28d supply, fill #4

## 2024-04-19 ENCOUNTER — Other Ambulatory Visit (HOSPITAL_BASED_OUTPATIENT_CLINIC_OR_DEPARTMENT_OTHER): Payer: Self-pay

## 2024-04-19 MED ORDER — MOUNJARO 15 MG/0.5ML ~~LOC~~ SOAJ
15.0000 mg | SUBCUTANEOUS | 3 refills | Status: AC
  Filled 2024-04-19 – 2024-05-10 (×2): qty 2, 28d supply, fill #0
  Filled 2024-06-07: qty 2, 28d supply, fill #1
  Filled 2024-07-06: qty 2, 28d supply, fill #2

## 2024-05-10 ENCOUNTER — Other Ambulatory Visit (HOSPITAL_BASED_OUTPATIENT_CLINIC_OR_DEPARTMENT_OTHER): Payer: Self-pay

## 2024-07-20 ENCOUNTER — Other Ambulatory Visit (HOSPITAL_BASED_OUTPATIENT_CLINIC_OR_DEPARTMENT_OTHER): Payer: Self-pay

## 2024-07-20 MED ORDER — MOUNJARO 15 MG/0.5ML ~~LOC~~ SOAJ
15.0000 mg | SUBCUTANEOUS | 3 refills | Status: AC
  Filled 2024-07-20: qty 2, 28d supply, fill #0
  Filled 2024-08-03: qty 6, 84d supply, fill #0

## 2024-07-20 MED ORDER — JARDIANCE 25 MG PO TABS
25.0000 mg | ORAL_TABLET | Freq: Every day | ORAL | 3 refills | Status: AC
  Filled 2024-07-20: qty 90, 90d supply, fill #0

## 2024-08-05 ENCOUNTER — Other Ambulatory Visit (HOSPITAL_BASED_OUTPATIENT_CLINIC_OR_DEPARTMENT_OTHER): Payer: Self-pay
